# Patient Record
Sex: Female | Born: 1955 | Race: White | Hispanic: No | Marital: Married | Smoking: Never smoker
Health system: Southern US, Community
[De-identification: ages and names within clinical notes are randomized; demographics above are authoritative.]

## PROBLEM LIST (undated history)

## (undated) DIAGNOSIS — E039 Hypothyroidism, unspecified: Secondary | ICD-10-CM

## (undated) DIAGNOSIS — Z87442 Personal history of urinary calculi: Secondary | ICD-10-CM

## (undated) DIAGNOSIS — I340 Nonrheumatic mitral (valve) insufficiency: Secondary | ICD-10-CM

## (undated) DIAGNOSIS — I493 Ventricular premature depolarization: Secondary | ICD-10-CM

## (undated) DIAGNOSIS — E079 Disorder of thyroid, unspecified: Secondary | ICD-10-CM

## (undated) DIAGNOSIS — I10 Essential (primary) hypertension: Secondary | ICD-10-CM

## (undated) HISTORY — DX: Personal history of urinary calculi: Z87.442

## (undated) HISTORY — DX: Disorder of thyroid, unspecified: E07.9

## (undated) HISTORY — DX: Ventricular premature depolarization: I49.3

## (undated) HISTORY — DX: Essential (primary) hypertension: I10

## (undated) HISTORY — DX: Hypothyroidism, unspecified: E03.9

## (undated) HISTORY — DX: Nonrheumatic mitral (valve) insufficiency: I34.0

## (undated) HISTORY — PX: TONSILLECTOMY: SUR1361

## (undated) HISTORY — PX: FOOT SURGERY: SHX648

## (undated) HISTORY — PX: CATARACT EXTRACTION, BILATERAL: SHX1313

## (undated) HISTORY — PX: GASTRIC BYPASS: SHX52

---

## 1986-11-20 HISTORY — PX: NASAL SEPTUM SURGERY: SHX37

## 1998-03-12 ENCOUNTER — Encounter: Admission: RE | Admit: 1998-03-12 | Discharge: 1998-03-12 | Payer: Self-pay | Admitting: Family Medicine

## 1999-06-20 ENCOUNTER — Other Ambulatory Visit: Admission: RE | Admit: 1999-06-20 | Discharge: 1999-06-20 | Payer: Self-pay | Admitting: Obstetrics and Gynecology

## 1999-07-11 ENCOUNTER — Encounter: Admission: RE | Admit: 1999-07-11 | Discharge: 1999-07-11 | Payer: Self-pay | Admitting: Family Medicine

## 1999-07-15 ENCOUNTER — Encounter: Admission: RE | Admit: 1999-07-15 | Discharge: 1999-07-15 | Payer: Self-pay | Admitting: Family Medicine

## 2000-01-20 ENCOUNTER — Encounter: Admission: RE | Admit: 2000-01-20 | Discharge: 2000-01-20 | Payer: Self-pay | Admitting: Family Medicine

## 2000-06-14 ENCOUNTER — Other Ambulatory Visit: Admission: RE | Admit: 2000-06-14 | Discharge: 2000-06-14 | Payer: Self-pay | Admitting: Obstetrics and Gynecology

## 2001-02-22 ENCOUNTER — Encounter: Admission: RE | Admit: 2001-02-22 | Discharge: 2001-02-22 | Payer: Self-pay | Admitting: Family Medicine

## 2001-02-25 ENCOUNTER — Encounter: Admission: RE | Admit: 2001-02-25 | Discharge: 2001-02-25 | Payer: Self-pay | Admitting: Family Medicine

## 2001-03-04 ENCOUNTER — Encounter: Admission: RE | Admit: 2001-03-04 | Discharge: 2001-03-04 | Payer: Self-pay | Admitting: Family Medicine

## 2001-03-11 ENCOUNTER — Encounter: Admission: RE | Admit: 2001-03-11 | Discharge: 2001-03-11 | Payer: Self-pay | Admitting: Family Medicine

## 2001-04-29 ENCOUNTER — Encounter: Admission: RE | Admit: 2001-04-29 | Discharge: 2001-04-29 | Payer: Self-pay | Admitting: Family Medicine

## 2001-05-13 ENCOUNTER — Encounter: Admission: RE | Admit: 2001-05-13 | Discharge: 2001-05-13 | Payer: Self-pay | Admitting: Family Medicine

## 2001-08-05 ENCOUNTER — Encounter: Admission: RE | Admit: 2001-08-05 | Discharge: 2001-08-05 | Payer: Self-pay | Admitting: Family Medicine

## 2001-08-14 ENCOUNTER — Encounter: Admission: RE | Admit: 2001-08-14 | Discharge: 2001-08-14 | Payer: Self-pay | Admitting: Family Medicine

## 2001-09-09 ENCOUNTER — Other Ambulatory Visit: Admission: RE | Admit: 2001-09-09 | Discharge: 2001-09-09 | Payer: Self-pay | Admitting: Obstetrics and Gynecology

## 2001-09-18 ENCOUNTER — Encounter: Payer: Self-pay | Admitting: Obstetrics and Gynecology

## 2001-09-18 ENCOUNTER — Encounter: Admission: RE | Admit: 2001-09-18 | Discharge: 2001-09-18 | Payer: Self-pay | Admitting: Internal Medicine

## 2002-04-07 ENCOUNTER — Encounter: Admission: RE | Admit: 2002-04-07 | Discharge: 2002-04-07 | Payer: Self-pay | Admitting: Family Medicine

## 2002-10-02 ENCOUNTER — Encounter: Admission: RE | Admit: 2002-10-02 | Discharge: 2002-10-02 | Payer: Self-pay | Admitting: Family Medicine

## 2003-03-03 ENCOUNTER — Encounter: Admission: RE | Admit: 2003-03-03 | Discharge: 2003-03-03 | Payer: Self-pay | Admitting: Family Medicine

## 2003-03-16 ENCOUNTER — Encounter: Admission: RE | Admit: 2003-03-16 | Discharge: 2003-03-16 | Payer: Self-pay | Admitting: Family Medicine

## 2003-05-04 ENCOUNTER — Encounter: Admission: RE | Admit: 2003-05-04 | Discharge: 2003-05-04 | Payer: Self-pay | Admitting: Sports Medicine

## 2003-05-04 ENCOUNTER — Encounter: Payer: Self-pay | Admitting: Sports Medicine

## 2003-05-04 ENCOUNTER — Encounter: Admission: RE | Admit: 2003-05-04 | Discharge: 2003-05-04 | Payer: Self-pay | Admitting: Family Medicine

## 2003-06-09 ENCOUNTER — Other Ambulatory Visit: Admission: RE | Admit: 2003-06-09 | Discharge: 2003-06-09 | Payer: Self-pay | Admitting: Obstetrics and Gynecology

## 2004-07-05 ENCOUNTER — Encounter: Admission: RE | Admit: 2004-07-05 | Discharge: 2004-07-05 | Payer: Self-pay | Admitting: Obstetrics and Gynecology

## 2004-08-05 ENCOUNTER — Ambulatory Visit: Payer: Self-pay | Admitting: Family Medicine

## 2004-08-31 ENCOUNTER — Ambulatory Visit: Payer: Self-pay | Admitting: Family Medicine

## 2004-09-26 ENCOUNTER — Ambulatory Visit: Payer: Self-pay

## 2004-10-21 ENCOUNTER — Ambulatory Visit: Payer: Self-pay | Admitting: Internal Medicine

## 2005-11-10 ENCOUNTER — Ambulatory Visit: Payer: Self-pay | Admitting: Family Medicine

## 2005-12-11 ENCOUNTER — Encounter: Admission: RE | Admit: 2005-12-11 | Discharge: 2005-12-11 | Payer: Self-pay | Admitting: Obstetrics and Gynecology

## 2006-05-20 ENCOUNTER — Encounter (INDEPENDENT_AMBULATORY_CARE_PROVIDER_SITE_OTHER): Payer: Self-pay | Admitting: *Deleted

## 2006-05-20 LAB — CONVERTED CEMR LAB

## 2006-10-05 ENCOUNTER — Ambulatory Visit: Payer: Self-pay | Admitting: Family Medicine

## 2006-10-08 ENCOUNTER — Ambulatory Visit: Payer: Self-pay | Admitting: Family Medicine

## 2006-10-17 ENCOUNTER — Encounter: Payer: Self-pay | Admitting: Cardiovascular Disease

## 2006-10-17 ENCOUNTER — Encounter (INDEPENDENT_AMBULATORY_CARE_PROVIDER_SITE_OTHER): Payer: Self-pay | Admitting: *Deleted

## 2006-10-17 ENCOUNTER — Ambulatory Visit (HOSPITAL_COMMUNITY): Admission: RE | Admit: 2006-10-17 | Discharge: 2006-10-17 | Payer: Self-pay | Admitting: Family Medicine

## 2006-10-17 ENCOUNTER — Ambulatory Visit: Payer: Self-pay | Admitting: Cardiovascular Disease

## 2006-11-02 ENCOUNTER — Ambulatory Visit: Payer: Self-pay | Admitting: Family Medicine

## 2007-01-03 ENCOUNTER — Encounter: Payer: Self-pay | Admitting: Family Medicine

## 2007-01-03 ENCOUNTER — Ambulatory Visit: Payer: Self-pay | Admitting: Sports Medicine

## 2007-01-03 ENCOUNTER — Encounter: Admission: RE | Admit: 2007-01-03 | Discharge: 2007-01-03 | Payer: Self-pay | Admitting: Obstetrics and Gynecology

## 2007-01-03 LAB — CONVERTED CEMR LAB
Chloride: 99 meq/L (ref 96–112)
Creatinine, Ser: 0.91 mg/dL (ref 0.40–1.20)
Direct LDL: 113 mg/dL — ABNORMAL HIGH
Potassium: 3.8 meq/L (ref 3.5–5.3)
Sodium: 139 meq/L (ref 135–145)

## 2007-01-07 ENCOUNTER — Ambulatory Visit: Payer: Self-pay | Admitting: Family Medicine

## 2007-01-17 DIAGNOSIS — E669 Obesity, unspecified: Secondary | ICD-10-CM | POA: Insufficient documentation

## 2007-01-17 DIAGNOSIS — G4730 Sleep apnea, unspecified: Secondary | ICD-10-CM | POA: Insufficient documentation

## 2007-01-17 DIAGNOSIS — E7800 Pure hypercholesterolemia, unspecified: Secondary | ICD-10-CM

## 2007-01-17 DIAGNOSIS — I10 Essential (primary) hypertension: Secondary | ICD-10-CM | POA: Insufficient documentation

## 2007-01-17 DIAGNOSIS — N951 Menopausal and female climacteric states: Secondary | ICD-10-CM

## 2007-01-17 DIAGNOSIS — E039 Hypothyroidism, unspecified: Secondary | ICD-10-CM

## 2007-01-17 DIAGNOSIS — E785 Hyperlipidemia, unspecified: Secondary | ICD-10-CM | POA: Insufficient documentation

## 2007-01-18 ENCOUNTER — Encounter (INDEPENDENT_AMBULATORY_CARE_PROVIDER_SITE_OTHER): Payer: Self-pay | Admitting: *Deleted

## 2007-02-19 ENCOUNTER — Telehealth: Payer: Self-pay | Admitting: Family Medicine

## 2007-04-05 ENCOUNTER — Telehealth: Payer: Self-pay | Admitting: *Deleted

## 2007-05-22 ENCOUNTER — Ambulatory Visit: Payer: Self-pay | Admitting: Pulmonary Disease

## 2007-06-13 ENCOUNTER — Ambulatory Visit: Payer: Self-pay | Admitting: Pulmonary Disease

## 2007-06-13 LAB — CONVERTED CEMR LAB
ALT: 24 units/L (ref 0–35)
Albumin: 3.5 g/dL (ref 3.5–5.2)
Alkaline Phosphatase: 57 units/L (ref 39–117)
BUN: 18 mg/dL (ref 6–23)
Basophils Relative: 3.4 % — ABNORMAL HIGH (ref 0.0–1.0)
Bilirubin, Direct: 0.1 mg/dL (ref 0.0–0.3)
CO2: 32 meq/L (ref 19–32)
Chloride: 106 meq/L (ref 96–112)
Creatinine, Ser: 0.8 mg/dL (ref 0.4–1.2)
Eosinophils Relative: 4.3 % (ref 0.0–5.0)
GFR calc Af Amer: 97 mL/min
HDL: 38.4 mg/dL — ABNORMAL LOW (ref >39.0)
LDL Cholesterol: 108 mg/dL — ABNORMAL HIGH (ref 0–99)
MCHC: 35.1 g/dL (ref 30.0–36.0)
Monocytes Relative: 8.5 % (ref 3.0–11.0)
Potassium: 3.7 meq/L (ref 3.5–5.1)
RDW: 12.6 % (ref 11.5–14.6)
Sodium: 147 meq/L — ABNORMAL HIGH (ref 135–145)
TSH: 5.68 microintl units/mL — ABNORMAL HIGH (ref 0.35–5.50)
Total Bilirubin: 0.7 mg/dL (ref 0.3–1.2)
Total Protein: 6.3 g/dL (ref 6.0–8.3)
Triglycerides: 177 mg/dL — ABNORMAL HIGH (ref 0–149)

## 2007-06-25 ENCOUNTER — Encounter: Payer: Self-pay | Admitting: Family Medicine

## 2007-06-27 ENCOUNTER — Telehealth: Payer: Self-pay | Admitting: *Deleted

## 2007-06-28 ENCOUNTER — Ambulatory Visit: Payer: Self-pay | Admitting: Pulmonary Disease

## 2007-08-29 ENCOUNTER — Ambulatory Visit (HOSPITAL_COMMUNITY): Admission: RE | Admit: 2007-08-29 | Discharge: 2007-08-29 | Payer: Self-pay | Admitting: Obstetrics and Gynecology

## 2007-09-03 ENCOUNTER — Ambulatory Visit (HOSPITAL_COMMUNITY): Admission: RE | Admit: 2007-09-03 | Discharge: 2007-09-03 | Payer: Self-pay | Admitting: Surgery

## 2007-09-13 ENCOUNTER — Telehealth: Payer: Self-pay | Admitting: Family Medicine

## 2007-09-17 ENCOUNTER — Encounter: Admission: RE | Admit: 2007-09-17 | Discharge: 2007-09-17 | Payer: Self-pay | Admitting: Surgery

## 2007-10-29 ENCOUNTER — Ambulatory Visit: Payer: Self-pay | Admitting: Pulmonary Disease

## 2007-11-15 ENCOUNTER — Telehealth: Payer: Self-pay | Admitting: Pulmonary Disease

## 2007-11-26 ENCOUNTER — Ambulatory Visit: Payer: Self-pay | Admitting: Pulmonary Disease

## 2007-12-01 LAB — CONVERTED CEMR LAB: TSH: 2.6 microintl units/mL (ref 0.35–5.50)

## 2008-01-16 ENCOUNTER — Encounter: Admission: RE | Admit: 2008-01-16 | Discharge: 2008-01-16 | Payer: Self-pay | Admitting: Obstetrics and Gynecology

## 2008-04-06 ENCOUNTER — Telehealth (INDEPENDENT_AMBULATORY_CARE_PROVIDER_SITE_OTHER): Payer: Self-pay | Admitting: *Deleted

## 2008-04-20 HISTORY — PX: GASTRIC BYPASS: SHX52

## 2008-04-21 ENCOUNTER — Encounter: Admission: RE | Admit: 2008-04-21 | Discharge: 2008-07-20 | Payer: Self-pay | Admitting: Surgery

## 2008-04-23 ENCOUNTER — Encounter: Payer: Self-pay | Admitting: Pulmonary Disease

## 2008-04-27 ENCOUNTER — Telehealth (INDEPENDENT_AMBULATORY_CARE_PROVIDER_SITE_OTHER): Payer: Self-pay | Admitting: *Deleted

## 2008-05-05 ENCOUNTER — Inpatient Hospital Stay (HOSPITAL_COMMUNITY): Admission: RE | Admit: 2008-05-05 | Discharge: 2008-05-07 | Payer: Self-pay | Admitting: Surgery

## 2008-05-06 ENCOUNTER — Ambulatory Visit: Payer: Self-pay | Admitting: Vascular Surgery

## 2008-05-06 ENCOUNTER — Encounter (INDEPENDENT_AMBULATORY_CARE_PROVIDER_SITE_OTHER): Payer: Self-pay | Admitting: Surgery

## 2008-05-08 ENCOUNTER — Telehealth (INDEPENDENT_AMBULATORY_CARE_PROVIDER_SITE_OTHER): Payer: Self-pay | Admitting: *Deleted

## 2008-05-27 ENCOUNTER — Encounter: Payer: Self-pay | Admitting: Pulmonary Disease

## 2008-06-08 ENCOUNTER — Ambulatory Visit: Payer: Self-pay | Admitting: Internal Medicine

## 2008-06-08 ENCOUNTER — Telehealth (INDEPENDENT_AMBULATORY_CARE_PROVIDER_SITE_OTHER): Payer: Self-pay | Admitting: *Deleted

## 2008-07-07 ENCOUNTER — Ambulatory Visit: Payer: Self-pay | Admitting: Pulmonary Disease

## 2008-07-07 DIAGNOSIS — L259 Unspecified contact dermatitis, unspecified cause: Secondary | ICD-10-CM | POA: Insufficient documentation

## 2008-07-08 ENCOUNTER — Ambulatory Visit: Payer: Self-pay | Admitting: Pulmonary Disease

## 2008-07-12 LAB — CONVERTED CEMR LAB: Vit D, 1,25-Dihydroxy: 25 — ABNORMAL LOW (ref 30–89)

## 2008-07-13 ENCOUNTER — Telehealth (INDEPENDENT_AMBULATORY_CARE_PROVIDER_SITE_OTHER): Payer: Self-pay | Admitting: *Deleted

## 2008-07-13 LAB — CONVERTED CEMR LAB
Albumin: 3.5 g/dL (ref 3.5–5.2)
BUN: 11 mg/dL (ref 6–23)
CO2: 29 meq/L (ref 19–32)
Creatinine, Ser: 0.6 mg/dL (ref 0.4–1.2)
Eosinophils Absolute: 0.2 10*3/uL (ref 0.0–0.7)
GFR calc Af Amer: 134 mL/min
GFR calc non Af Amer: 111 mL/min
Glucose, Bld: 98 mg/dL (ref 70–99)
HCT: 36.8 % (ref 36.0–46.0)
MCHC: 34.3 g/dL (ref 30.0–36.0)
MCV: 91.8 fL (ref 78.0–100.0)
Monocytes Absolute: 0.4 10*3/uL (ref 0.1–1.0)
Neutrophils Relative %: 45.3 % (ref 43.0–77.0)
Platelets: 140 10*3/uL — ABNORMAL LOW (ref 150–400)
RDW: 15.2 % — ABNORMAL HIGH (ref 11.5–14.6)
Sodium: 147 meq/L — ABNORMAL HIGH (ref 135–145)
Total Bilirubin: 0.8 mg/dL (ref 0.3–1.2)
Total CHOL/HDL Ratio: 4.8
Total Protein: 5.8 g/dL — ABNORMAL LOW (ref 6.0–8.3)

## 2008-10-02 ENCOUNTER — Telehealth (INDEPENDENT_AMBULATORY_CARE_PROVIDER_SITE_OTHER): Payer: Self-pay | Admitting: *Deleted

## 2008-10-02 ENCOUNTER — Encounter: Payer: Self-pay | Admitting: Pulmonary Disease

## 2009-01-21 ENCOUNTER — Encounter: Admission: RE | Admit: 2009-01-21 | Discharge: 2009-01-21 | Payer: Self-pay | Admitting: Obstetrics and Gynecology

## 2009-04-20 ENCOUNTER — Ambulatory Visit: Payer: Self-pay | Admitting: Pulmonary Disease

## 2009-04-20 DIAGNOSIS — M109 Gout, unspecified: Secondary | ICD-10-CM | POA: Insufficient documentation

## 2009-04-24 DIAGNOSIS — E559 Vitamin D deficiency, unspecified: Secondary | ICD-10-CM | POA: Insufficient documentation

## 2009-04-24 LAB — CONVERTED CEMR LAB
Albumin: 3.8 g/dL (ref 3.5–5.2)
Alkaline Phosphatase: 75 units/L (ref 39–117)
Basophils Absolute: 0 10*3/uL (ref 0.0–0.1)
Basophils Relative: 0.8 % (ref 0.0–3.0)
Chloride: 108 meq/L (ref 96–112)
Eosinophils Relative: 3.5 % (ref 0.0–5.0)
HDL: 54.2 mg/dL (ref >39.00)
Hemoglobin: 13.6 g/dL (ref 12.0–15.0)
Lymphocytes Relative: 32.1 % (ref 12.0–46.0)
Lymphs Abs: 1.2 10*3/uL (ref 0.7–4.0)
MCHC: 34.9 g/dL (ref 30.0–36.0)
MCV: 89.5 fL (ref 78.0–100.0)
Neutro Abs: 2 10*3/uL (ref 1.4–7.7)
Neutrophils Relative %: 54.8 % (ref 43.0–77.0)
Platelets: 126 10*3/uL — ABNORMAL LOW (ref 150.0–400.0)
Potassium: 3.9 meq/L (ref 3.5–5.1)
RDW: 12.6 % (ref 11.5–14.6)
Sodium: 144 meq/L (ref 135–145)
Total CHOL/HDL Ratio: 3
Triglycerides: 117 mg/dL (ref 0.0–149.0)
Uric Acid, Serum: 3.3 mg/dL (ref 2.4–7.0)
Vitamin B-12: >1500 pg/mL — ABNORMAL HIGH (ref 211–911)

## 2009-04-27 ENCOUNTER — Telehealth: Payer: Self-pay | Admitting: Pulmonary Disease

## 2009-06-17 ENCOUNTER — Telehealth: Payer: Self-pay | Admitting: Pulmonary Disease

## 2009-08-02 ENCOUNTER — Telehealth: Payer: Self-pay | Admitting: Pulmonary Disease

## 2009-08-04 ENCOUNTER — Ambulatory Visit: Payer: Self-pay | Admitting: Pulmonary Disease

## 2009-08-04 DIAGNOSIS — N390 Urinary tract infection, site not specified: Secondary | ICD-10-CM | POA: Insufficient documentation

## 2009-08-06 ENCOUNTER — Ambulatory Visit (HOSPITAL_COMMUNITY): Admission: RE | Admit: 2009-08-06 | Discharge: 2009-08-06 | Payer: Self-pay | Admitting: Pulmonary Disease

## 2009-08-07 LAB — CONVERTED CEMR LAB
AST: 20 units/L (ref 0–37)
Albumin: 3.8 g/dL (ref 3.5–5.2)
BUN: 14 mg/dL (ref 6–23)
Basophils Absolute: 0 10*3/uL (ref 0.0–0.1)
Basophils Relative: 0.8 % (ref 0.0–3.0)
CO2: 33 meq/L — ABNORMAL HIGH (ref 19–32)
Eosinophils Relative: 5.3 % — ABNORMAL HIGH (ref 0.0–5.0)
Glucose, Bld: 93 mg/dL (ref 70–99)
Hemoglobin, Urine: NEGATIVE
Hemoglobin: 13.6 g/dL (ref 12.0–15.0)
Ketones, ur: NEGATIVE mg/dL
Lymphocytes Relative: 37 % (ref 12.0–46.0)
Monocytes Absolute: 0.4 10*3/uL (ref 0.1–1.0)
Neutro Abs: 1.6 10*3/uL (ref 1.4–7.7)
Neutrophils Relative %: 46.4 % (ref 43.0–77.0)
Platelets: 118 10*3/uL — ABNORMAL LOW (ref 150.0–400.0)
Potassium: 4.1 meq/L (ref 3.5–5.1)
Sodium: 145 meq/L (ref 135–145)
TSH: 0.04 microintl units/mL — ABNORMAL LOW (ref 0.35–5.50)
Total Bilirubin: 0.8 mg/dL (ref 0.3–1.2)
Total Protein, Urine: NEGATIVE mg/dL
Total Protein: 6.3 g/dL (ref 6.0–8.3)
Urine Glucose: NEGATIVE mg/dL
Urobilinogen, UA: 0.2 (ref 0.0–1.0)

## 2009-09-20 ENCOUNTER — Telehealth (INDEPENDENT_AMBULATORY_CARE_PROVIDER_SITE_OTHER): Payer: Self-pay | Admitting: *Deleted

## 2009-09-28 ENCOUNTER — Telehealth (INDEPENDENT_AMBULATORY_CARE_PROVIDER_SITE_OTHER): Payer: Self-pay | Admitting: *Deleted

## 2009-09-30 ENCOUNTER — Ambulatory Visit: Payer: Self-pay | Admitting: Pulmonary Disease

## 2009-10-27 ENCOUNTER — Encounter: Payer: Self-pay | Admitting: Pulmonary Disease

## 2009-11-16 LAB — CONVERTED CEMR LAB
Bilirubin Urine: NEGATIVE
Hemoglobin, Urine: NEGATIVE

## 2010-01-24 ENCOUNTER — Encounter: Admission: RE | Admit: 2010-01-24 | Discharge: 2010-01-24 | Payer: Self-pay | Admitting: Obstetrics and Gynecology

## 2010-09-08 ENCOUNTER — Ambulatory Visit (HOSPITAL_COMMUNITY): Admission: RE | Admit: 2010-09-08 | Discharge: 2010-09-08 | Payer: Self-pay | Admitting: Internal Medicine

## 2010-12-30 ENCOUNTER — Other Ambulatory Visit: Payer: Self-pay | Admitting: Internal Medicine

## 2010-12-30 DIAGNOSIS — Z1231 Encounter for screening mammogram for malignant neoplasm of breast: Secondary | ICD-10-CM

## 2011-01-26 ENCOUNTER — Ambulatory Visit
Admission: RE | Admit: 2011-01-26 | Discharge: 2011-01-26 | Disposition: A | Discharge status: Home / Self Care | Payer: BC Managed Care – PPO | Source: Ambulatory Visit | Attending: Internal Medicine | Admitting: Internal Medicine

## 2011-01-26 DIAGNOSIS — Z1231 Encounter for screening mammogram for malignant neoplasm of breast: Secondary | ICD-10-CM

## 2011-04-04 NOTE — Op Note (Signed)
NAMEYESLI, VANDERHOFF               ACCOUNT NO.:  [id number]   MEDICAL RECORD NO.:  [id number]          PATIENT TYPE:  INP   LOCATION:  0003                         FACILITY:  [facility]   PHYSICIAN:  Sandria Bales. Ezzard Standing, M.D.  DATE OF BIRTH:  1955-12-18   DATE OF PROCEDURE:  05/05/2008  DATE OF DISCHARGE:                               OPERATIVE REPORT   PREOPERATIVE DIAGNOSIS:  Morbid obesity, BMI of 48.4, weight 290.   POSTOPERATIVE DIAGNOSIS:  Morbid obesity, BMI of 48.4, weight 290.   PROCEDURE:  Laparoscopic Roux En Y gastric bypass (antecolic,  antegastric), upper endoscopy.   SURGEON:  Sandria Bales. Ezzard Standing, M.D.   ASSISTANT:  Thornton Park. Daphine Deutscher, M.D.   ANESTHESIA:  General endotracheal.   ESTIMATED BLOOD LOSS:  Minimal.   INDICATIONS FOR PROCEDURE:  The patient is a 55 year old white female  patient of Dr. Kriste Basque who has been through our preoperative bariatric  program.  She has contemplated and reviewed both Lap-Band and Roux En Y  gastric bypass and is interested in getting Roux En Y gastric bypass.   The indications and potential complications of gastric bypass are  explained to the patient.  The potential complications include, but are  not limited to bleeding, infection, bowel injury or leak, open surgery,  deep venous thrombosis/pulmonary embolism, and longterm nutritional  consequences.  The patient now comes to the operating room for surgery.   DESCRIPTION OF PROCEDURE:  The patient was placed in the supine position  and given a general endotracheal anesthetic.  She had PAS stockings in  place.  She was given antibiotics preoperatively.  She had an OG-tube  placed.  Her abdomen was prepped with Techni-Care and sterilely draped.  A time out was held identifying the patient and the procedure.   The abdomen was accessed through the left upper quadrant.  She did have  a bout of hypotension at the very beginning of the case.  The patient  was placed in Trendelenburg for about 5  minutes before we accessed the  abdominal cavity but this resolved with fluids by anesthesia.  Of note,  there was no further hypotension during the case.   The patient's abdominal cavity was accessed with a 0 degree 11 mm  Ethicon OptiVu in the left upper quadrant.  I placed 6 additional  trocars.  I placed a 5 mm subxiphoid trocar, I placed a 12 mm right  subcostal, a 12 mm right paramedian, a 12 mm left paramedian, and a 5 mm  lateral subcostal, and now placed an 11 mm trocar to the right of the  umbilicus.  The first thing to do was abdominal exploration.  The right  and left lobes of the liver was unremarkable.  The stomach that I could  see was unremarkable.  The bowel that I could see was unremarkable.  She  had a fairly generous omentum.  I started out by identifying the  ligament of Treitz and small bowel.  I measured 40 cm from the ligament  of Treitz.  I used a white load of the 45 Ethicon endo-GIA stapler and  divided the small bowel.  I marked the future gastric limb with the  Penrose drain.   I then counted 100 cm for the future gastric limb.  This 100 cm fell to  the right of the abdomen.  I then carried out a jejunojejunostomy.  I  did a holding suture, placed enterotomy in the jejunum.  I did the  anastomosis with a 45 mm Ethicon Endo-GIA white staple load.  I closed  the enterotomy with two running 2-0 Vicryl sutures.  There was no  evidence of any leak or disruption in the suture line.  I then closed  the mesentery of the jejunojejunostomy with a running 2-0 silk with a  lap tie on both ends.  I tried also incorporating some of an  antiobstruction stitch up at the jejunojejunostomy.   I then covered the anastomosis with Tisseel.  The anastomosis looked  healthy without any leak.   I then divided the omentum up to the transverse colon and this was just  a little to the left of the midline.  Again she had a fairly generous  omentum, though it was not overly thick.   The patient was then placed in  Trendelenburg position, a Nathanson liver retractor placed on the left  lobe of the liver and started with dissection.   I counted down about 5 cm along the lesser curvature.  I then got into  the lesser sac and level of lesser curvature.  I divided the stomach  initially with a 45 Endo-GIA blue stapler and then I used two loads of  the 60 Endo-GIA stapler and then another 45 blue suture to create a  pouch approximately 5 cm in length and 3 cm in width.   I oversewed the distal gastric remnant with a locking 2-0 Vicryl suture.  I placed Tisseel along the future greater curvature of the stomach  pouch.  I then brought the jejunum up antecolic antegastric and sewed it  to the gastric pouch with a posterior row of running 2-0 Vicryl suture.   After the pouch was tacked to the jejunum, I did an enterotomy in the  stomach over the Roux En Y tube.  I did an enterotomy on the jejunal  side and I passed the Endo-GIA stapler with a blue in about 30-35 cm  trying to create a 2.5 to 3 cm anastomosis.   After the firing of this, I then closed the gastric enterotomy with two  running 2-0 Vicryl sutures.  I was worried about one little area of the  anterior anastomosis and I put a little Z-stitch of 2-0 Vicryl over  this.  I then did an anterior running 2-0 Vicryl suture after I had  passed the ewall tube through the anastomosis.  The anastomosis was with  the stomach and jejunum looked healthy.  There was no evidence of any  ischemia.   At this time Dr. Daphine Deutscher scrubbed out.  He went the head of the table and  did an upper endoscopy which he will dictate.  He passed the endoscope  into the gastric pouch and insufflated air.  He saw the GE junction  about 40 cm and the gastrojejunal anastomosis at 45 cm for about a 5 cm  pouch.  There was no air leak.  The mucosa was healthy as was the  anastomosis.  He then withdrew the scope.  I irrigated the upper abdomen  with  saline looking for any kind of air leak which there  was not.  I  then placed Tisseel over the gastrojejunostomy and over the stump of the  jejunum.  Our anastomosis looked patent and healthy.  The bowel laid in  the correct plane.  The jejunojejunostomy looked healthy and the omentum  had been divided.   I then removed the trocars in turn.  I closed each skin site with a 5-0  Monocryl suture.  I painted the wound with tincture Benzoin and Steri-  Strips.   The patient tolerated the procedure well and was transported to the  recovery room in good condition.  Needle, sponge, and instrument counts  correct at the end of the case.      Sandria Bales. Ezzard Standing, M.D.  Electronically Signed     DHN/MEDQ  D:  05/05/2008  T:  05/05/2008  Job:  [id number]   cc:   Lonzo Cloud. Kriste Basque, MD  [address]  [place]  [place] [address]   Cordelia Pen A. Rosalio Macadamia, M.D.  Fax: [phone]

## 2011-04-11 ENCOUNTER — Encounter (INDEPENDENT_AMBULATORY_CARE_PROVIDER_SITE_OTHER): Payer: Self-pay | Admitting: Surgery

## 2011-05-31 ENCOUNTER — Encounter: Payer: Self-pay | Admitting: Family Medicine

## 2011-05-31 ENCOUNTER — Ambulatory Visit (INDEPENDENT_AMBULATORY_CARE_PROVIDER_SITE_OTHER): Payer: BC Managed Care – PPO | Admitting: Family Medicine

## 2011-05-31 DIAGNOSIS — E039 Hypothyroidism, unspecified: Secondary | ICD-10-CM

## 2011-05-31 DIAGNOSIS — I10 Essential (primary) hypertension: Secondary | ICD-10-CM

## 2011-05-31 DIAGNOSIS — F419 Anxiety disorder, unspecified: Secondary | ICD-10-CM

## 2011-05-31 DIAGNOSIS — Z23 Encounter for immunization: Secondary | ICD-10-CM

## 2011-05-31 DIAGNOSIS — H18519 Endothelial corneal dystrophy, unspecified eye: Secondary | ICD-10-CM | POA: Insufficient documentation

## 2011-05-31 DIAGNOSIS — E559 Vitamin D deficiency, unspecified: Secondary | ICD-10-CM

## 2011-05-31 DIAGNOSIS — F411 Generalized anxiety disorder: Secondary | ICD-10-CM

## 2011-05-31 DIAGNOSIS — E7800 Pure hypercholesterolemia, unspecified: Secondary | ICD-10-CM

## 2011-05-31 LAB — POCT URINALYSIS DIPSTICK
Bilirubin, UA: NEGATIVE
Leukocytes, UA: NEGATIVE

## 2011-05-31 MED ORDER — LORAZEPAM 1 MG PO TABS: 1.0000 mg | ORAL_TABLET | Freq: Every day | ORAL | Status: DC

## 2011-05-31 MED ORDER — TETANUS-DIPHTH-ACELL PERTUSSIS 5-2.5-18.5 LF-MCG/0.5 IM SUSP
0.5000 mL | Freq: Once | INTRAMUSCULAR | Status: AC
  Administered 2011-05-31: 0.5 mL via INTRAMUSCULAR

## 2011-05-31 MED ORDER — POTASSIUM CHLORIDE CRYS ER 10 MEQ PO TBCR: 15.0000 meq | EXTENDED_RELEASE_TABLET | Freq: Every day | ORAL | Status: DC

## 2011-05-31 MED ORDER — LEVOTHYROXINE SODIUM 112 MCG PO TABS: 224.0000 ug | ORAL_TABLET | Freq: Every day | ORAL | Status: DC

## 2011-05-31 MED ORDER — ATENOLOL 100 MG PO TABS: 100.0000 mg | ORAL_TABLET | Freq: Every day | ORAL | Status: DC

## 2011-05-31 NOTE — Assessment & Plan Note (Signed)
Currently well controled

## 2011-05-31 NOTE — Assessment & Plan Note (Signed)
Check labs 

## 2011-05-31 NOTE — Assessment & Plan Note (Signed)
Actually takes 1.5 tabs =168 micrograms of levothyroxine daily.  Due for TSH check

## 2011-05-31 NOTE — Progress Notes (Signed)
  Subjective:    Patient ID: Jacqueline Hayes, female    DOB: 09/27/1956, 55 y.o.   MRN: [id number]  HPI Jacqueline Hayes returns after several year hiatus.  She had bariatric surgery and has lost 130 lbs total.  She feels much improved and is delighted she took the leap.   Hypertension - remains on atenolol and control has been good.   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 Anxiety an intermitant issue.  Takes lorazepam very rarely. No other complaints.    Review of Systems     Objective:   Physical Exam Lungs clear Cardiac RRR       Assessment & Plan:

## 2011-06-01 ENCOUNTER — Encounter: Payer: Self-pay | Admitting: Family Medicine

## 2011-06-01 LAB — COMPLETE METABOLIC PANEL WITH GFR
Alkaline Phosphatase: 99 U/L (ref 39–117)
CO2: 31 mEq/L (ref 19–32)
Chloride: 102 mEq/L (ref 96–112)
GFR, Est Non African American: >60 mL/min (ref >60)
Glucose, Bld: 83 mg/dL (ref 70–99)
Sodium: 140 mEq/L (ref 135–145)

## 2011-06-01 LAB — LIPID PANEL
Cholesterol: 205 mg/dL — ABNORMAL HIGH (ref 0–200)
HDL: 72 mg/dL (ref >39)
Total CHOL/HDL Ratio: 2.8 Ratio
Triglycerides: 177 mg/dL — ABNORMAL HIGH (ref <150)

## 2011-06-01 LAB — VITAMIN D 25 HYDROXY (VIT D DEFICIENCY, FRACTURES): Vit D, 25-Hydroxy: 59 ng/mL (ref 30–89)

## 2011-06-01 LAB — CBC
Hemoglobin: 13.6 g/dL (ref 12.0–15.0)
MCH: 30 pg (ref 26.0–34.0)
RBC: 4.54 MIL/uL (ref 3.87–5.11)

## 2011-08-17 LAB — CBC
HCT: 39.1
HCT: 34 — ABNORMAL LOW
Hemoglobin: 13.2
Hemoglobin: 11.4 — ABNORMAL LOW
Hemoglobin: 11.6 — ABNORMAL LOW
Hemoglobin: 11.9 — ABNORMAL LOW
MCHC: 33.8
MCHC: 33.8
MCV: 90.7
MCV: 91.4
RBC: 3.72 — ABNORMAL LOW
RBC: 3.77 — ABNORMAL LOW
RBC: 3.88
WBC: 4.6
WBC: 8.3

## 2011-08-17 LAB — PREGNANCY, URINE: Preg Test, Ur: NEGATIVE

## 2011-08-17 LAB — DIFFERENTIAL
Basophils Relative: 1
Eosinophils Absolute: 0
Eosinophils Absolute: 0.1
Eosinophils Relative: 2
Lymphocytes Relative: 21
Lymphs Abs: 1
Lymphs Abs: 0.6 — ABNORMAL LOW
Monocytes Absolute: 0.7
Monocytes Absolute: 0.4
Monocytes Absolute: 0.6
Monocytes Relative: 15 — ABNORMAL HIGH
Monocytes Relative: 7
Monocytes Relative: 7
Neutro Abs: 2.9
Neutrophils Relative %: 65
Neutrophils Relative %: 86 — ABNORMAL HIGH

## 2011-08-17 LAB — BASIC METABOLIC PANEL
BUN: 34 — ABNORMAL HIGH
CO2: 26
Chloride: 101
Glucose, Bld: 104 — ABNORMAL HIGH
Potassium: 4.5

## 2011-08-17 LAB — PROTIME-INR: INR: 1

## 2011-10-16 ENCOUNTER — Telehealth (INDEPENDENT_AMBULATORY_CARE_PROVIDER_SITE_OTHER): Payer: Self-pay | Admitting: Surgery

## 2011-10-16 NOTE — Telephone Encounter (Signed)
10/16/11 mailed recall to patient for bariatric surgery follow-up. Adv pt to call [facility] to schedule an appt...cef (in order to comply with [facility] guidelines, pt needs to be seen on or prior to 11/03/11)

## 2011-11-02 ENCOUNTER — Ambulatory Visit (INDEPENDENT_AMBULATORY_CARE_PROVIDER_SITE_OTHER): Payer: Self-pay | Admitting: Surgery

## 2011-11-03 ENCOUNTER — Encounter: Payer: Self-pay | Admitting: Family Medicine

## 2011-11-03 ENCOUNTER — Ambulatory Visit (INDEPENDENT_AMBULATORY_CARE_PROVIDER_SITE_OTHER): Payer: BC Managed Care – PPO | Admitting: Family Medicine

## 2011-11-03 VITALS — BP 128/68 | HR 55 | Temp 98.3°F | Ht 65.0 in | Wt 177.5 lb

## 2011-11-03 DIAGNOSIS — J019 Acute sinusitis, unspecified: Secondary | ICD-10-CM | POA: Insufficient documentation

## 2011-11-03 MED ORDER — AMOXICILLIN-POT CLAVULANATE 875-125 MG PO TABS: 1.0000 | ORAL_TABLET | Freq: Two times a day (BID) | ORAL | Status: AC

## 2011-11-03 NOTE — Patient Instructions (Signed)
Please get the antibiotic I prescribed.  Continue your over the counter meds You only need to be seen again if worsening or failure to improve.

## 2011-11-03 NOTE — Progress Notes (Signed)
  Subjective:    Patient ID: Jacqueline Hayes, female    DOB: 28-Oct-1956, 55 y.o.   MRN: [id number]  HPI Patient with 7 day hx of resp illness with cough, nasal congestion, minimal ear pain, low grade fever.  Yesterday, had worsening with increase in facial pressure and discomfort    Review of Systems No high fever, shortness of breath. Not immunocompromised.    Objective:   Physical Exam TMs nl Throat cobblestoned. Tenderness to percussion of maxillary sinuses Rt>Lt Neck, shoddy ant cervical nodes. Lungs clear       Assessment & Plan:

## 2011-11-03 NOTE — Assessment & Plan Note (Signed)
Treat given exam and second sickening.

## 2011-11-15 ENCOUNTER — Telehealth: Payer: Self-pay | Admitting: Family Medicine

## 2011-11-15 NOTE — Telephone Encounter (Signed)
Would like to speak to someone about what to do for a stopped up ear.  She is leaving for the beach today.

## 2011-11-15 NOTE — Telephone Encounter (Signed)
Was treated for sinus infection a week ago  and that has cleared. On 12/22 her ear stopped up and has been stopped up ever since.  No tenderness, itches a little bit.  She feels fine otherwise.  Using saline solution nasally. Leaving for beach in one hour.

## 2011-11-15 NOTE — Telephone Encounter (Signed)
Sounds like eustacian tube dysfunction.  Will advise to use Afrin for no more than three consecutive days.

## 2011-11-23 ENCOUNTER — Encounter: Payer: Self-pay | Admitting: Family Medicine

## 2011-11-23 ENCOUNTER — Ambulatory Visit (INDEPENDENT_AMBULATORY_CARE_PROVIDER_SITE_OTHER): Payer: BC Managed Care – PPO | Admitting: Family Medicine

## 2011-11-23 DIAGNOSIS — H9190 Unspecified hearing loss, unspecified ear: Secondary | ICD-10-CM | POA: Insufficient documentation

## 2011-11-23 DIAGNOSIS — J019 Acute sinusitis, unspecified: Secondary | ICD-10-CM

## 2011-11-23 NOTE — Patient Instructions (Signed)
Take pseudoephedrine or phenylephrine (decongestants)  Will make ENT appointment for you for further evaluation

## 2011-11-23 NOTE — Progress Notes (Signed)
  Subjective:    Patient ID: YESSICA PUTNAM, female    DOB: 26-Jun-1956, 56 y.o.   MRN: [id number]  HPIHere for evaluation of right ear congestion x 2-3 weeks  Saw PCP on 12/14 for sinuitis was having ear congestion at that time as well.  Was rxed augmentin and took afrin as well as OTC cold medications.  Rhinorrhea and nasal congestion improved.  Continued to feel "stuffy" in right ear no improvement.  No ear pain, drainage,  Fever, dizziness, tinnitus.  Review of Systems     Objective:   Physical Exam  GEN; NAD Ears: bilateral TM wnl, right TM slightly more vascular than left but not generalized erythema.  No obvious effusion. No cerumen.   Audiometry shows decreased hearing on right.  Weber and Rinne suggest conductive hearing loss.      Assessment & Plan:

## 2011-11-23 NOTE — Assessment & Plan Note (Addendum)
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  No evidence of effusion or active infection.  (completed course of Augmentin)  Patient not open to trial of decongestant.  I have made a referral to ENT for further evaluation and management.

## 2011-11-24 ENCOUNTER — Ambulatory Visit: Payer: BC Managed Care – PPO | Admitting: Family Medicine

## 2011-12-01 ENCOUNTER — Encounter (HOSPITAL_COMMUNITY): Payer: Self-pay

## 2011-12-01 ENCOUNTER — Other Ambulatory Visit: Payer: Self-pay | Admitting: Family Medicine

## 2011-12-01 ENCOUNTER — Emergency Department (HOSPITAL_COMMUNITY): Payer: BC Managed Care – PPO

## 2011-12-01 ENCOUNTER — Emergency Department (HOSPITAL_COMMUNITY)
Admission: EM | Admit: 2011-12-01 | Discharge: 2011-12-01 | Disposition: A | Discharge status: Home / Self Care | Payer: BC Managed Care – PPO | Attending: Emergency Medicine | Admitting: Emergency Medicine

## 2011-12-01 DIAGNOSIS — Z79899 Other long term (current) drug therapy: Secondary | ICD-10-CM | POA: Insufficient documentation

## 2011-12-01 DIAGNOSIS — E039 Hypothyroidism, unspecified: Secondary | ICD-10-CM | POA: Insufficient documentation

## 2011-12-01 DIAGNOSIS — N201 Calculus of ureter: Secondary | ICD-10-CM | POA: Insufficient documentation

## 2011-12-01 DIAGNOSIS — R109 Unspecified abdominal pain: Secondary | ICD-10-CM | POA: Insufficient documentation

## 2011-12-01 DIAGNOSIS — I10 Essential (primary) hypertension: Secondary | ICD-10-CM | POA: Insufficient documentation

## 2011-12-01 LAB — URINE MICROSCOPIC-ADD ON

## 2011-12-01 LAB — URINALYSIS, ROUTINE W REFLEX MICROSCOPIC
Leukocytes, UA: NEGATIVE
Nitrite: NEGATIVE
Protein, ur: NEGATIVE mg/dL
Urobilinogen, UA: 0.2 mg/dL (ref 0.0–1.0)

## 2011-12-01 MED ORDER — ONDANSETRON HCL 4 MG/2ML IJ SOLN
4.0000 mg | Freq: Once | INTRAMUSCULAR | Status: AC
  Administered 2011-12-01: 4 mg via INTRAVENOUS

## 2011-12-01 MED ORDER — KETOROLAC TROMETHAMINE 30 MG/ML IJ SOLN
30.0000 mg | Freq: Once | INTRAMUSCULAR | Status: AC
  Administered 2011-12-01: 30 mg via INTRAVENOUS

## 2011-12-01 MED ORDER — SULFAMETHOXAZOLE-TRIMETHOPRIM 800-160 MG PO TABS: 1.0000 | ORAL_TABLET | Freq: Two times a day (BID) | ORAL | Status: AC

## 2011-12-01 MED ORDER — ONDANSETRON HCL 4 MG/2ML IJ SOLN
INTRAMUSCULAR | Status: AC
  Filled 2011-12-01: qty 2

## 2011-12-01 MED ORDER — OXYCODONE-ACETAMINOPHEN 5-325 MG PO TABS: 2.0000 | ORAL_TABLET | ORAL | Status: DC | PRN

## 2011-12-01 MED ORDER — KETOROLAC TROMETHAMINE 30 MG/ML IJ SOLN
INTRAMUSCULAR | Status: AC
  Filled 2011-12-01: qty 1

## 2011-12-01 MED ORDER — ONDANSETRON HCL 4 MG PO TABS: 8.0000 mg | ORAL_TABLET | Freq: Four times a day (QID) | ORAL | Status: DC

## 2011-12-01 MED ORDER — OXYCODONE-ACETAMINOPHEN 5-325 MG PO TABS: 1.0000 | ORAL_TABLET | ORAL | Status: AC | PRN

## 2011-12-01 MED ORDER — ONDANSETRON HCL 4 MG PO TABS: 8.0000 mg | ORAL_TABLET | Freq: Four times a day (QID) | ORAL | Status: AC

## 2011-12-01 NOTE — Telephone Encounter (Signed)
Refill request

## 2011-12-01 NOTE — ED Provider Notes (Signed)
History     CSN: [id number]  Arrival date & time 12/01/11  1138   First MD Initiated Contact with Patient 12/01/11 1211      Chief Complaint  Patient presents with  . Abdominal Pain    x this am. Feels heavy urge to have a BM and urinate at same time.  Esp LLQ/L flank  . Nausea    vomited x 1 from pain.    (Consider location/radiation/quality/duration/timing/severity/associated sxs/prior treatment) HPI  Patient with left sided pain began this a.m. At about 0800.  Noted some urinary urgency last night, then had rectal pressure, resolved during night with return of urgency this a.m.  Left lower back pain began this a.m. Sharp constant worsening through morning.  Became nauseated but worsens with sitting or standing.  Pain improving with toradol given here.  Pain at worse 10, now 3,     Past Medical History  Diagnosis Date  . Hypertension   . Thyroid disease     Past Surgical History  Procedure Date  . Gastric bypass 04/2008  . Tonsillectomy   . Foot surgery     as a child  . Nasal septum surgery     History reviewed. No pertinent family history.  History  Substance Use Topics  . Smoking status: Never Smoker   . Smokeless tobacco: Not on file  . Alcohol Use: No    OB History    Grav Para Term Preterm Abortions TAB SAB Ect Mult Living                  Review of Systems  All other systems reviewed and are negative.    Allergies  Review of patient's allergies indicates not on file.  Home Medications   Current Outpatient Rx  Name Route Sig Dispense Refill  . ATENOLOL 100 MG PO TABS Oral Take 100 mg by mouth daily.    Marland Kitchen DOXYCYCLINE MONOHYDRATE 100 MG PO TABS Oral Take 100 mg by mouth 2 (two) times daily. Pt's on this therapy for 21 days. Started on 11-24-2011 pt's on day 7 of therapy.    Marland Kitchen LEVOTHYROXINE SODIUM 112 MCG PO TABS Oral Take 112 mcg by mouth daily.    Marland Kitchen CHILDRENS CHEWABLE MULTI VITS PO Oral Take by mouth.    Marland Kitchen POTASSIUM CHLORIDE ER 10 MEQ PO TBCR  Oral Take 15 mEq by mouth daily. Pt takes 1 and 1/2 tablet for 15 meq    . PREDNISONE 10 MG PO KIT Oral Take 10 mg by mouth See admin instructions. 6 tabs x 6 days, 5 tabs x 5 days, 4 tabs x 4 days, 3 tabs x 3 days, 2 tabs x 2 days, 1 tab x 1 day. Finished prednisone on 11-30-11.    Marland Kitchen VITAMIN C 500 MG PO TABS Oral Take 500 mg by mouth daily.      BP 169/66  Pulse 55  Temp(Src) 97.8 F (36.6 C) (Oral)  Resp 18  Ht 5\' 5"  (1.651 m)  Wt 178 lb (80.74 kg)  BMI 29.62 kg/m2  SpO2 100%  Physical Exam  Nursing note and vitals reviewed. Constitutional: She is oriented to person, place, and time. She appears well-developed and well-nourished.  HENT:  Head: Normocephalic and atraumatic.  Right Ear: External ear normal.  Left Ear: External ear normal.  Nose: Nose normal.  Mouth/Throat: Oropharynx is clear and moist.  Eyes: Conjunctivae and EOM are normal. Pupils are equal, round, and reactive to light.  Neck: Normal range of motion.  Neck supple.  Cardiovascular: Normal rate, regular rhythm and normal heart sounds.   Pulmonary/Chest: Effort normal and breath sounds normal.  Abdominal: Soft. Bowel sounds are normal.  Musculoskeletal: Normal range of motion.  Neurological: She is alert and oriented to person, place, and time. She has normal reflexes.  Skin: Skin is warm and dry.  Psychiatric: She has a normal mood and affect.    ED Course  Procedures (including critical care time)  Labs Reviewed - No data to display No results found.   No diagnosis found.    MDM  Patient with many rbc in urine and pain relieved with toradol.  Awaiting ct results.    Patient with good pain control.    Left uvj stone at 3 mm with obstruction.      Hilario Quarry, MD 12/01/11 [phone]

## 2011-12-05 ENCOUNTER — Telehealth: Payer: Self-pay | Admitting: Family Medicine

## 2011-12-05 NOTE — Telephone Encounter (Signed)
Is asking about the Laneta Simmers - wants to know if this has been called in.  (in the meds list it says "no print")

## 2011-12-05 NOTE — Telephone Encounter (Signed)
Spoke with Larita Fife and told her the Rx for Lorazepam had been called in and is ready for her to pick it up at the [facility] in [place].  Ileana Ladd

## 2011-12-18 ENCOUNTER — Encounter: Payer: Self-pay | Admitting: Family Medicine

## 2011-12-25 ENCOUNTER — Other Ambulatory Visit: Payer: Self-pay | Admitting: Obstetrics and Gynecology

## 2011-12-25 DIAGNOSIS — Z1231 Encounter for screening mammogram for malignant neoplasm of breast: Secondary | ICD-10-CM

## 2012-01-29 ENCOUNTER — Ambulatory Visit
Admission: RE | Admit: 2012-01-29 | Discharge: 2012-01-29 | Disposition: A | Discharge status: Home / Self Care | Payer: BC Managed Care – PPO | Source: Ambulatory Visit | Attending: Obstetrics and Gynecology | Admitting: Obstetrics and Gynecology

## 2012-01-29 DIAGNOSIS — Z1231 Encounter for screening mammogram for malignant neoplasm of breast: Secondary | ICD-10-CM

## 2012-02-07 ENCOUNTER — Ambulatory Visit (INDEPENDENT_AMBULATORY_CARE_PROVIDER_SITE_OTHER): Payer: BC Managed Care – PPO | Admitting: Family Medicine

## 2012-02-07 ENCOUNTER — Encounter: Payer: Self-pay | Admitting: Family Medicine

## 2012-02-07 VITALS — BP 126/82 | HR 60 | Temp 98.2°F | Ht 65.0 in | Wt 178.0 lb

## 2012-02-07 DIAGNOSIS — J40 Bronchitis, not specified as acute or chronic: Secondary | ICD-10-CM

## 2012-02-07 MED ORDER — AZITHROMYCIN 500 MG PO TABS: 500.0000 mg | ORAL_TABLET | Freq: Every day | ORAL | Status: AC

## 2012-02-07 NOTE — Patient Instructions (Signed)
I sent an antibiotic prescription in for you. If you are not definitely improving in 10 days, see me (call first).

## 2012-02-08 NOTE — Progress Notes (Signed)
  Subjective:    Patient ID: Jacqueline Hayes, female    DOB: 08/24/1956, 56 y.o.   MRN: [id number]  HPI  Sick with respiratory infection for 6-8 weeks.  Already seen x 2.  No fever.  Still with cough.  Worrisomely, not improving.  Minimal nasal congestion.  No rhinorrhea.    Review of Systems     Objective:   Physical Exam TMs nl Throat nl Neck without sig nodes Lungs clear       Assessment & Plan:

## 2012-02-08 NOTE — Assessment & Plan Note (Signed)
Continued respiratory infection with cough.  Will treat empirically with antibiotics.  Will need further WU - CXR and TB skin test if fails to improve.

## 2012-03-25 ENCOUNTER — Encounter: Payer: Self-pay | Admitting: Family Medicine

## 2012-03-25 DIAGNOSIS — Z9884 Bariatric surgery status: Secondary | ICD-10-CM | POA: Insufficient documentation

## 2012-05-24 ENCOUNTER — Other Ambulatory Visit: Payer: Self-pay | Admitting: Family Medicine

## 2012-05-24 DIAGNOSIS — I10 Essential (primary) hypertension: Secondary | ICD-10-CM

## 2012-06-13 ENCOUNTER — Ambulatory Visit (INDEPENDENT_AMBULATORY_CARE_PROVIDER_SITE_OTHER): Payer: BC Managed Care – PPO | Admitting: Family Medicine

## 2012-06-13 ENCOUNTER — Encounter: Payer: Self-pay | Admitting: Family Medicine

## 2012-06-13 VITALS — BP 106/72 | HR 64 | Temp 98.1°F | Ht 65.0 in | Wt 180.7 lb

## 2012-06-13 DIAGNOSIS — F419 Anxiety disorder, unspecified: Secondary | ICD-10-CM

## 2012-06-13 DIAGNOSIS — I10 Essential (primary) hypertension: Secondary | ICD-10-CM

## 2012-06-13 DIAGNOSIS — Z9884 Bariatric surgery status: Secondary | ICD-10-CM

## 2012-06-13 DIAGNOSIS — Z0000 Encounter for general adult medical examination without abnormal findings: Secondary | ICD-10-CM

## 2012-06-13 DIAGNOSIS — F411 Generalized anxiety disorder: Secondary | ICD-10-CM

## 2012-06-13 DIAGNOSIS — E039 Hypothyroidism, unspecified: Secondary | ICD-10-CM

## 2012-06-13 LAB — COMPREHENSIVE METABOLIC PANEL
ALT: 12 U/L (ref 0–35)
Alkaline Phosphatase: 95 U/L (ref 39–117)
Sodium: 142 mEq/L (ref 135–145)
Total Bilirubin: 0.4 mg/dL (ref 0.3–1.2)
Total Protein: 6 g/dL (ref 6.0–8.3)

## 2012-06-13 LAB — LIPID PANEL
LDL Cholesterol: 104 mg/dL — ABNORMAL HIGH (ref 0–99)
Triglycerides: 135 mg/dL (ref <150)
VLDL: 27 mg/dL (ref 0–40)

## 2012-06-13 LAB — TSH: TSH: 3.281 u[IU]/mL (ref 0.350–4.500)

## 2012-06-13 LAB — CBC
MCH: 30.5 pg (ref 26.0–34.0)
MCHC: 34.8 g/dL (ref 30.0–36.0)
Platelets: 150 10*3/uL (ref 150–400)
RDW: 13.4 % (ref 11.5–15.5)

## 2012-06-13 MED ORDER — ATENOLOL 100 MG PO TABS: 100.0000 mg | ORAL_TABLET | Freq: Every day | ORAL | Status: DC

## 2012-06-13 MED ORDER — POTASSIUM CHLORIDE ER 10 MEQ PO TBCR: 15.0000 meq | EXTENDED_RELEASE_TABLET | Freq: Every day | ORAL | Status: DC

## 2012-06-13 MED ORDER — LEVOTHYROXINE SODIUM 112 MCG PO TABS: 112.0000 ug | ORAL_TABLET | Freq: Every day | ORAL | Status: DC

## 2012-06-13 MED ORDER — LORAZEPAM 1 MG PO TABS: 1.0000 mg | ORAL_TABLET | Freq: Every day | ORAL | Status: DC

## 2012-06-13 NOTE — Patient Instructions (Addendum)
Cut your atenolol in 1/2 and monitor your blood pressure. Let me know after a couple of months so that I can update your records. Also let me know the date of you next pap smear Ask about doing pap smears ever three years.

## 2012-06-13 NOTE — Assessment & Plan Note (Signed)
Very well controled.  Will try decrease atenolol

## 2012-06-17 ENCOUNTER — Encounter: Payer: Self-pay | Admitting: Family Medicine

## 2012-06-18 DIAGNOSIS — Z0000 Encounter for general adult medical examination without abnormal findings: Secondary | ICD-10-CM | POA: Insufficient documentation

## 2012-06-18 NOTE — Assessment & Plan Note (Signed)
Overall, very healthy since bariatric surg.

## 2012-06-18 NOTE — Assessment & Plan Note (Signed)
Blood work

## 2012-06-18 NOTE — Assessment & Plan Note (Signed)
Well controled on lorazepam

## 2012-06-18 NOTE — Progress Notes (Signed)
  Subjective:    Patient ID: Jacqueline Hayes, female    DOB: 05/17/1956, 56 y.o.   MRN: [id number]  HPI  Annual exam.  Gets pap and mammo by gyn. Feels great since bariatric surg.  Has lost about 90 lbs total. Hypertension has been well controled by home readings. Needs three month refills on meds Up to date on health maint No unhealthy life style issues    Review of Systems Denies CP, SOB, syncope, swelling or bleeding.  Denies bowel, bladder or weight changes.     Objective:   Physical ExamHEENT normal Neck supple without thyromegally Lungs clear Cardiac RRR without m or g Abd benign Ext no edema        Assessment & Plan:

## 2012-11-25 ENCOUNTER — Telehealth (INDEPENDENT_AMBULATORY_CARE_PROVIDER_SITE_OTHER): Payer: Self-pay | Admitting: Surgery

## 2012-11-25 NOTE — Telephone Encounter (Signed)
11/25/12 mailed bariatric surgery recall letter to patient. Advised patient to call [phone] to schedule a bariatric follow-up appt. ls

## 2012-12-23 ENCOUNTER — Other Ambulatory Visit: Payer: Self-pay | Admitting: Obstetrics and Gynecology

## 2012-12-23 DIAGNOSIS — Z1231 Encounter for screening mammogram for malignant neoplasm of breast: Secondary | ICD-10-CM

## 2013-01-10 DIAGNOSIS — H2510 Age-related nuclear cataract, unspecified eye: Secondary | ICD-10-CM | POA: Insufficient documentation

## 2013-01-13 ENCOUNTER — Encounter: Payer: Self-pay | Admitting: Family Medicine

## 2013-01-13 NOTE — Progress Notes (Signed)
Patient ID: Jacqueline Hayes, female   DOB: 1956-07-03, 57 y.o.   MRN: [id number] Ophthal records update Fuchs.  See scanned report for details.

## 2013-01-29 ENCOUNTER — Ambulatory Visit
Admission: RE | Admit: 2013-01-29 | Discharge: 2013-01-29 | Disposition: A | Discharge status: Home / Self Care | Payer: BC Managed Care – PPO | Source: Ambulatory Visit | Attending: Obstetrics and Gynecology | Admitting: Obstetrics and Gynecology

## 2013-01-29 DIAGNOSIS — Z1231 Encounter for screening mammogram for malignant neoplasm of breast: Secondary | ICD-10-CM

## 2013-04-07 ENCOUNTER — Other Ambulatory Visit: Payer: Self-pay | Admitting: *Deleted

## 2013-04-07 DIAGNOSIS — E039 Hypothyroidism, unspecified: Secondary | ICD-10-CM

## 2013-04-07 MED ORDER — LEVOTHYROXINE SODIUM 112 MCG PO TABS: 112.0000 ug | ORAL_TABLET | Freq: Every day | ORAL | Status: DC

## 2013-04-07 NOTE — Telephone Encounter (Signed)
Requested Prescriptions   Pending Prescriptions Disp Refills  . levothyroxine (SYNTHROID, LEVOTHROID) 112 MCG tablet 90 tablet 3    Sig: Take 1 tablet (112 mcg total) by mouth daily.   Pharmacy notes that they have changed manufactures - is it ok to substitute for pt? Wyatt Haste, RN-BSN

## 2013-04-21 ENCOUNTER — Telehealth: Payer: Self-pay | Admitting: Family Medicine

## 2013-04-21 NOTE — Telephone Encounter (Signed)
Agree with approving switch.

## 2013-04-21 NOTE — Telephone Encounter (Signed)
Pt called. [facility] pharamcy states levothyroxine is changing and it will be very expensive to refill. Wants to have another brand. Needs drs approval to change the brand. Please contact husband Harvie Heck. Pt is at work

## 2013-04-21 NOTE — Telephone Encounter (Signed)
Spoke with pharmacist at [facility] and they are switching the manufacturer for levothyroxine and needed approval to do so for patient.  Switch approved.  Pt's husband notified.  Sukhman Kocher, Darlyne Russian, CMA

## 2013-06-13 ENCOUNTER — Other Ambulatory Visit: Payer: Self-pay | Admitting: Family Medicine

## 2013-06-13 NOTE — Telephone Encounter (Signed)
Patient has an appt on 8/15.

## 2013-07-04 ENCOUNTER — Ambulatory Visit (INDEPENDENT_AMBULATORY_CARE_PROVIDER_SITE_OTHER): Payer: BC Managed Care – PPO | Admitting: Family Medicine

## 2013-07-04 ENCOUNTER — Encounter: Payer: Self-pay | Admitting: Family Medicine

## 2013-07-04 VITALS — BP 137/63 | HR 47 | Temp 98.1°F | Ht 64.0 in | Wt 184.2 lb

## 2013-07-04 DIAGNOSIS — I10 Essential (primary) hypertension: Secondary | ICD-10-CM

## 2013-07-04 DIAGNOSIS — E039 Hypothyroidism, unspecified: Secondary | ICD-10-CM

## 2013-07-04 DIAGNOSIS — Z9884 Bariatric surgery status: Secondary | ICD-10-CM

## 2013-07-04 DIAGNOSIS — Z0000 Encounter for general adult medical examination without abnormal findings: Secondary | ICD-10-CM

## 2013-07-04 LAB — CBC
HCT: 39.5 % (ref 36.0–46.0)
MCH: 29.7 pg (ref 26.0–34.0)
MCHC: 33.7 g/dL (ref 30.0–36.0)
RDW: 14 % (ref 11.5–15.5)

## 2013-07-04 MED ORDER — VALSARTAN 80 MG PO TABS: 80.0000 mg | ORAL_TABLET | Freq: Every day | ORAL | Status: DC

## 2013-07-04 NOTE — Assessment & Plan Note (Signed)
Check labs 

## 2013-07-04 NOTE — Assessment & Plan Note (Signed)
Very healthy 

## 2013-07-04 NOTE — Patient Instructions (Addendum)
Talk to Dr. Verlee Monte about Paps every three years and quitting at age 57 You will be due for another colonoscopy in 2016. If you sign up for MyChart, you will be able to check much of this stuff on line. For one week take 1/2 tab of atenolol daily then quit. You will need another blood test in one week. I will call with lab results.

## 2013-07-04 NOTE — Assessment & Plan Note (Signed)
Check B12 

## 2013-07-04 NOTE — Progress Notes (Signed)
  Subjective:    Patient ID: Jacqueline Hayes, female    DOB: [birth year], 56 y.o.   MRN: [id number]  HPI Feels good.  Still working.  We discussed and she is still leery of flu vaccine.  Otherwise up to date on HPDP.  Sees Dr. Verlee Monte for annual pap and pelvic.  Is S/P gastric bypass.   She is up to date on other HPDP measures. She sees her gastric bypass as life changing.  Still has some joint pains but can live with them    Review of Systems Denies Chest pain, SOB, bleeding, wt change, stool change, skin lesions.     Objective:   Physical Exam HEENT nl Neck supple without nodes Lungs clear Cardiac RRR without m or g Abd benign Ext no edema       Assessment & Plan:

## 2013-07-05 LAB — COMPLETE METABOLIC PANEL WITH GFR
AST: 14 U/L (ref 0–37)
Albumin: 4.1 g/dL (ref 3.5–5.2)
Alkaline Phosphatase: 120 U/L — ABNORMAL HIGH (ref 39–117)
BUN: 15 mg/dL (ref 6–23)
Potassium: 4.1 mEq/L (ref 3.5–5.3)
Total Bilirubin: 0.4 mg/dL (ref 0.3–1.2)

## 2013-07-05 LAB — VITAMIN B12: Vitamin B-12: >2000 pg/mL — ABNORMAL HIGH (ref 211–911)

## 2013-07-05 LAB — LIPID PANEL
Cholesterol: 209 mg/dL — ABNORMAL HIGH (ref 0–200)
HDL: 67 mg/dL (ref >39)
Total CHOL/HDL Ratio: 3.1 Ratio
Triglycerides: 142 mg/dL (ref <150)

## 2013-07-05 LAB — TSH: TSH: 5.45 u[IU]/mL — ABNORMAL HIGH (ref 0.350–4.500)

## 2013-07-07 ENCOUNTER — Encounter: Payer: Self-pay | Admitting: Family Medicine

## 2013-07-11 ENCOUNTER — Other Ambulatory Visit: Payer: BC Managed Care – PPO

## 2013-07-11 DIAGNOSIS — I10 Essential (primary) hypertension: Secondary | ICD-10-CM

## 2013-07-11 LAB — BASIC METABOLIC PANEL
BUN: 21 mg/dL (ref 6–23)
Calcium: 8.9 mg/dL (ref 8.4–10.5)
Creat: 0.56 mg/dL (ref 0.50–1.10)
Glucose, Bld: 118 mg/dL — ABNORMAL HIGH (ref 70–99)
Sodium: 142 mEq/L (ref 135–145)

## 2013-07-11 NOTE — Progress Notes (Signed)
BMP DONE TODAY Madhavi Hamblen 

## 2013-07-14 ENCOUNTER — Ambulatory Visit (HOSPITAL_COMMUNITY)
Admission: RE | Admit: 2013-07-14 | Discharge: 2013-07-14 | Disposition: A | Discharge status: Home / Self Care | Payer: BC Managed Care – PPO | Source: Ambulatory Visit | Attending: Family Medicine | Admitting: Family Medicine

## 2013-07-14 ENCOUNTER — Telehealth: Payer: Self-pay | Admitting: Family Medicine

## 2013-07-14 ENCOUNTER — Ambulatory Visit (INDEPENDENT_AMBULATORY_CARE_PROVIDER_SITE_OTHER): Payer: BC Managed Care – PPO | Admitting: Family Medicine

## 2013-07-14 ENCOUNTER — Encounter: Payer: Self-pay | Admitting: Family Medicine

## 2013-07-14 VITALS — BP 135/88 | HR 70 | Temp 97.8°F | Wt 188.0 lb

## 2013-07-14 DIAGNOSIS — Z87898 Personal history of other specified conditions: Secondary | ICD-10-CM

## 2013-07-14 DIAGNOSIS — I498 Other specified cardiac arrhythmias: Secondary | ICD-10-CM

## 2013-07-14 DIAGNOSIS — I10 Essential (primary) hypertension: Secondary | ICD-10-CM

## 2013-07-14 MED ORDER — ATENOLOL 50 MG PO TABS: 50.0000 mg | ORAL_TABLET | Freq: Every evening | ORAL | Status: DC

## 2013-07-14 NOTE — Progress Notes (Signed)
Patient ID: Jacqueline Hayes, female   DOB: [birth year], 57 y.o.   MRN: [id number] Subjective:   CC: Follow up BP medication change  HPI:   1. Follow-up BP medication change - Pt is here for sameday appointment with elevated BP, anxiety, and SOB since medication change. She reports 9 days ago ending a taper off of atenolol (100mg  daily-->50mg  daily x 1 week --> off). She was recently started on valsartan and atenolol taper begun ~2 weeks ago due to bradycardia with atenolol. Since that time, she has felt "not good" with BP at home measuring in 140-160s/80-90s when baseline on atenolol had been 110s/60s. She reports "heart pounding", feeling "trembly," headaches, and some arm numbness. She wonders if it has to do with stopping atenolol.   Review of Systems - Per HPI.   SH:  - Drives schoolbus  FH: - FH of DM in 1st degree relatives, though pt does not have    Objective:  Physical Exam BP 135/88  Pulse 70  Temp(Src) 97.8 F (36.6 C) (Oral)  Wt 188 lb (85.276 kg)  BMI 32.25 kg/m2  SpO2 100% GEN: NAD HEENT: Atraumatic, normocephalic, neck supple, EOMI, sclera clear  CV: RRR, no murmurs, rubs, or gallops PULM: CTAB, normal effort ABD: Soft, nontender, nondistended SKIN: No rash or cyanosis; warm and well-perfused PSYCH: Mood and affect mildly anxious-appearing, normal rate and volume of speech NEURO: Awake, alert, no focal deficits grossly, normal speech    Assessment:     Jacqueline Hayes is a 57 y.o. female with h/o HTN and recenlty changed medications here for follow-up with symptoms concerning to pt since medication change.    Plan:     # See problem list for problem-specific plans.

## 2013-07-14 NOTE — Telephone Encounter (Signed)
Pt reports that she came in with low pulse at last visit on aug 15 and bp was perfect. Dr. Leveda Anna took her off of Atenolol  - take 50 mg of Atenolol and valstartan 80 mg for a week. After taking last Atenolol , bp has been high ( 162/90) and heart feels like it is racing and she is having shortness of breath. Appointment made for today at 345. Wyatt Haste, RN-BSN

## 2013-07-14 NOTE — Patient Instructions (Addendum)
EKG was normal today.  I am restarting atenolol at 50mg  which you can take in the evenings/before bedtime. Return in 2 weeks to follow up with Dr. Leveda Anna.  If you develop chest pain, shortness of breath, dizziness, fainting, or other concerns, seek immediate care.    Atenolol tablets What is this medicine? ATENOLOL (a TEN oh lole) is a beta-blocker. Beta-blockers reduce the workload on the heart and help it to beat more regularly. This medicine is used to treat high blood pressure and to prevent chest pain. It is also used to protect the heart during a heart attack and to prevent an additional heart attack from occurring. This medicine may be used for other purposes; ask your health care provider or pharmacist if you have questions. What should I tell my health care provider before I take this medicine? They need to know if you have any of these conditions: -diabetes -heart or vessel disease like slow heart rate, worsening heart failure, heart block, sick sinus syndrome or Raynaud's disease -kidney disease -lung or breathing disease, like asthma or emphysema -pheochromocytoma -thyroid disease -an unusual or allergic reaction to atenolol, other beta-blockers, medicines, foods, dyes, or preservatives -pregnant or trying to get pregnant -breast-feeding How should I use this medicine? Take this medicine by mouth with a drink of water. Follow the directions on the prescription label. This medicine may be taken with or without food. Take your medicine at regular intervals. Do not take more medicine than directed. Do not stop taking this medicine suddenly. This could lead to serious heart-related effects. Talk to your pediatrician regarding the use of this medicine in children. Special care may be needed. Overdosage: If you think you have taken too much of this medicine contact a poison control center or emergency room at once. NOTE: This medicine is only for you. Do not share this medicine with  others. What if I miss a dose? If you miss a dose, take it as soon as you can. If it is almost time for your next dose, take only that dose. Do not take double or extra doses. What may interact with this medicine? Do not take this medicine with any of the following medications: -sotalol This medicine may also interact with the following medications: -clonidine -digoxin -diuretics -dobutamine -epinephrine -isoproterenol -medicine for blood pressure like amlodipine, diltiazem, verapamil -NSAIDs, medicines for pain and inflammation, like ibuprofen or naproxen -reserpine This list may not describe all possible interactions. Give your health care provider a list of all the medicines, herbs, non-prescription drugs, or dietary supplements you use. Also tell them if you smoke, drink alcohol, or use illegal drugs. Some items may interact with your medicine. What should I watch for while using this medicine? Visit your doctor or health care professional for regular check ups. Check your blood pressure and pulse rate regularly. Ask your health care professional what your blood pressure and pulse rate should be, and when you should contact him or her. You may get drowsy or dizzy. Do not drive, use machinery, or do anything that needs mental alertness until you know how this medicine affects you. Do not stand or sit up quickly. Alcohol may interfere with the effect of this medicine. Avoid alcoholic drinks. This medicine can affect blood sugar levels. If you have diabetes, check with your doctor or health care professional before you change your diet or the dose of your diabetic medicine. Do not treat yourself for coughs, colds, or pain while you are taking this medicine without  asking your doctor or health care professional for advice. Some ingredients may increase your blood pressure. What side effects may I notice from receiving this medicine? Side effects that you should report to your doctor or health  care professional as soon as possible: -allergic reactions like skin rash, itching or hives, swelling of the face, lips, or tongue -breathing problems -changes in vision -chest pain -cold, tingling, or numb hands or feet -depression -fast, irregular heartbeat -feeling faint or lightheaded, falls -fever with sore throat -rapid weight gain -swollen ankles, legs Side effects that usually do not require medical attention (report to your doctor or health care professional if they continue or are bothersome): -anxiety, nervous -diarrhea -dry skin -change in sex drive or performance -headache -nightmares or trouble sleeping -short term memory loss -stomach upset -unusually tired This list may not describe all possible side effects. Call your doctor for medical advice about side effects. You may report side effects to FDA at 1-800-FDA-1088. Where should I keep my medicine? Keep out of the reach of children. Store at room temperature between 20 and 25 degrees C (68 and 77 degrees F). Close tightly and protect from light. Throw away any unused medicine after the expiration date. NOTE: This sheet is a summary. It may not cover all possible information. If you have questions about this medicine, talk to your doctor, pharmacist, or health care provider.  2012, Elsevier/Gold Standard. (01/29/2008 10:00:11 AM)

## 2013-07-14 NOTE — Assessment & Plan Note (Addendum)
Pt is concerned by symptoms (ha, chest pounding, relatively higher BP) since changing from atenolol to valsartan 1-2 weeks ago. Change was due to bradycardia, though it appears pt had been bradycardic for some years to upper 40s bpm, likely asymptomatic bradycardia, with good BP control during that time. Pt's current symptoms may also be related to her anxiety or she is very sensitive to current BP increase over her previous baseline. - EKG today since none recently - WNL with no heart block - Restart atenolol 50mg  daily (half of previous dose) to see if this helps bring BP closer to pt's baseline of <120/60-70, where she was asymptomatic. Continue valsartan. If BP or pulse too low or symptomatic, stop and let us know. - F/u with PCP in 2 weeks. - Return precautions reviewed - Discussed plan with preceptor Dr. Mauricio Po

## 2013-07-19 ENCOUNTER — Encounter: Payer: Self-pay | Admitting: Family Medicine

## 2013-07-23 ENCOUNTER — Other Ambulatory Visit: Payer: Self-pay | Admitting: Family Medicine

## 2013-07-23 MED ORDER — LEVOTHYROXINE SODIUM 125 MCG PO TABS: 125.0000 ug | ORAL_TABLET | Freq: Every day | ORAL | Status: DC

## 2013-07-30 ENCOUNTER — Ambulatory Visit (INDEPENDENT_AMBULATORY_CARE_PROVIDER_SITE_OTHER): Payer: BC Managed Care – PPO | Admitting: Family Medicine

## 2013-07-30 ENCOUNTER — Encounter: Payer: Self-pay | Admitting: Family Medicine

## 2013-07-30 VITALS — BP 138/65 | HR 58 | Temp 99.1°F | Ht 65.0 in | Wt 186.0 lb

## 2013-07-30 DIAGNOSIS — E039 Hypothyroidism, unspecified: Secondary | ICD-10-CM

## 2013-07-30 DIAGNOSIS — I10 Essential (primary) hypertension: Secondary | ICD-10-CM

## 2013-07-30 NOTE — Patient Instructions (Addendum)
Please get your thyroid blood test any time mid Oct or later.  Call the day before and tell them you are coming in.  I will call with the lab results.   If everything is good on the TSH and with your symptoms and blood pressure, I will see you in a year.  Sooner if problems.

## 2013-07-31 NOTE — Progress Notes (Signed)
  Subjective:    Patient ID: Jacqueline Hayes, female    DOB: 08/15/1956, 57 y.o.   MRN: [id number]  HPI  Patient returns for follow up.  She did not tolerate ARB - although it is unclear to me how much of her symptoms were due to ARB versus how much of her symptoms were due to beta blocker withdrawal.  Regardless, she is back on atenolol at 50 mg daily and feeling great.  The switch was made primarily because of bradycardia which is still present but less.  Confounding the issue was prehaps some hypothyroidism.  She had a slightly high TSH, which I would have generally ignored but she also had this bradycardia and she complains of significant hair loss.  Because of the combination of high TSH and symptoms, I increased her levothyroxine dose.      Review of Systems     Objective:   Physical Exam Pulse = 65 BP noted Lungs clear. Cardiac RRR without m or g       Assessment & Plan:

## 2013-07-31 NOTE — Assessment & Plan Note (Signed)
Doing well on current meds.  Hopefully, she will have manageable brady on lower dose of atenolol

## 2013-07-31 NOTE — Assessment & Plan Note (Signed)
Recheck TSH in 6 weeks on higher dose 

## 2013-09-11 ENCOUNTER — Other Ambulatory Visit: Payer: BC Managed Care – PPO

## 2013-09-11 DIAGNOSIS — E039 Hypothyroidism, unspecified: Secondary | ICD-10-CM

## 2013-09-11 NOTE — Progress Notes (Signed)
Drew TSH   Dewitt Hoes, MLS

## 2013-09-12 LAB — TSH: TSH: 1.494 u[IU]/mL (ref 0.350–4.500)

## 2013-09-15 ENCOUNTER — Other Ambulatory Visit: Payer: Self-pay | Admitting: Family Medicine

## 2013-09-25 ENCOUNTER — Other Ambulatory Visit: Payer: Self-pay

## 2013-12-24 ENCOUNTER — Other Ambulatory Visit: Payer: Self-pay

## 2013-12-24 DIAGNOSIS — Z1231 Encounter for screening mammogram for malignant neoplasm of breast: Secondary | ICD-10-CM

## 2013-12-28 ENCOUNTER — Encounter: Payer: Self-pay | Admitting: Family Medicine

## 2013-12-29 NOTE — Progress Notes (Signed)
Patient calls, has been sending MyChart emails to Dr. Leveda AnnaHensel but would like to talk to him regarding changing her BP meds. She states she is at home now, and is available to talk now.

## 2013-12-30 MED ORDER — AMLODIPINE BESYLATE 5 MG PO TABS: 5.0000 mg | ORAL_TABLET | Freq: Every day | ORAL | Status: DC

## 2013-12-30 NOTE — Telephone Encounter (Signed)
See reply to patient.

## 2014-02-03 ENCOUNTER — Ambulatory Visit: Payer: Self-pay

## 2014-02-04 ENCOUNTER — Ambulatory Visit
Admission: RE | Admit: 2014-02-04 | Discharge: 2014-02-04 | Disposition: A | Discharge status: Home / Self Care | Payer: BC Managed Care – PPO | Source: Ambulatory Visit

## 2014-02-04 DIAGNOSIS — Z1231 Encounter for screening mammogram for malignant neoplasm of breast: Secondary | ICD-10-CM

## 2014-02-26 ENCOUNTER — Encounter: Payer: Self-pay | Admitting: Family Medicine

## 2014-03-20 ENCOUNTER — Other Ambulatory Visit: Payer: Self-pay | Admitting: Family Medicine

## 2014-04-22 ENCOUNTER — Other Ambulatory Visit: Payer: Self-pay | Admitting: Family Medicine

## 2014-06-30 ENCOUNTER — Other Ambulatory Visit: Payer: Self-pay | Admitting: Family Medicine

## 2014-07-29 ENCOUNTER — Encounter: Payer: Self-pay | Admitting: Internal Medicine

## 2014-07-31 ENCOUNTER — Encounter: Payer: Self-pay | Admitting: Family Medicine

## 2014-07-31 ENCOUNTER — Ambulatory Visit (INDEPENDENT_AMBULATORY_CARE_PROVIDER_SITE_OTHER): Payer: BC Managed Care – PPO | Admitting: Family Medicine

## 2014-07-31 VITALS — BP 137/72 | HR 64 | Temp 97.9°F | Ht 65.0 in | Wt 188.8 lb

## 2014-07-31 DIAGNOSIS — Z0000 Encounter for general adult medical examination without abnormal findings: Secondary | ICD-10-CM

## 2014-07-31 DIAGNOSIS — I10 Essential (primary) hypertension: Secondary | ICD-10-CM

## 2014-07-31 DIAGNOSIS — E039 Hypothyroidism, unspecified: Secondary | ICD-10-CM

## 2014-07-31 DIAGNOSIS — E669 Obesity, unspecified: Secondary | ICD-10-CM

## 2014-07-31 MED ORDER — VALSARTAN 80 MG PO TABS: 80.0000 mg | ORAL_TABLET | Freq: Every day | ORAL | Status: DC

## 2014-07-31 NOTE — Assessment & Plan Note (Signed)
Check TSH 

## 2014-07-31 NOTE — Progress Notes (Signed)
   Subjective:    Patient ID: Jacqueline Hayes, female    DOB: 11/02/1956, 58 y.o.   MRN: [id number]  HPI Annual physical    Retired last spring. 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  We discussed end of life care.  As a result of that discussion, she will contact her mother's physicians, inform them of desire for comfort care only and ask for a referral to hospice. Refuses flu shot despite my attempts to convince.  Otherwise up to date but will need several things next year at age 44. BP home reading borderline high systolics.  Having leg swelling ever since started the amlodipine. She is taking her vitamins S/P bariatric surgery.  Previous labs OK.   She has no complaints. Healthy lifestyle - especially eating.  She knows she can exercise more.  No unsafe behavior.    Review of Systems ROS neg.     Objective:   Physical ExamHEENT normal Neck supple without masses Lungs clear Cardiac RRR without m or g Abd benign Ext 2+ edema bilaterally Neuro grossly normal.        Assessment & Plan:

## 2014-07-31 NOTE — Assessment & Plan Note (Signed)
Healthy lifestyle and chronic problems controled.  Up to date on prevention and screening.  She knows she needs to lose weight.  She is optimistic that she will be able to be more active when less leg swelling (see hypertension.)

## 2014-07-31 NOTE — Patient Instructions (Addendum)
Next year will be a big year for you. You will need another colonoscopy. You will need a shingles vaccine. The pneumonia shots (now two) hit when you turn 63. Stop the amlodipine because of the swelling. Restart the valsartan. Get your blood checked in 2 weeks.  We may be able to stop the potassium I will call with blood work results. Remember to talk to your mom's docs about hospice.

## 2014-07-31 NOTE — Assessment & Plan Note (Signed)
Amlodipine causing leg swelling.  Switch amlodipine to valsartan.  She has at home.  She was symptomatic from cessation of beta blocker at the time she previously quit the valsartan.

## 2014-08-01 LAB — COMPREHENSIVE METABOLIC PANEL
ALBUMIN: 4.1 g/dL (ref 3.5–5.2)
ALT: 12 U/L (ref 0–35)
AST: 15 U/L (ref 0–37)
Alkaline Phosphatase: 118 U/L — ABNORMAL HIGH (ref 39–117)
BUN: 18 mg/dL (ref 6–23)
CO2: 28 mEq/L (ref 19–32)
Calcium: 9 mg/dL (ref 8.4–10.5)
Chloride: 105 mEq/L (ref 96–112)
Creat: 0.57 mg/dL (ref 0.50–1.10)
GLUCOSE: 82 mg/dL (ref 70–99)
Potassium: 3.8 mEq/L (ref 3.5–5.3)
SODIUM: 141 meq/L (ref 135–145)
TOTAL PROTEIN: 6.1 g/dL (ref 6.0–8.3)
Total Bilirubin: 0.4 mg/dL (ref 0.2–1.2)

## 2014-08-01 LAB — LIPID PANEL
Cholesterol: 198 mg/dL (ref 0–200)
HDL: 69 mg/dL (ref >39)
LDL CALC: 104 mg/dL — AB (ref 0–99)
Total CHOL/HDL Ratio: 2.9 Ratio
Triglycerides: 125 mg/dL (ref <150)
VLDL: 25 mg/dL (ref 0–40)

## 2014-08-01 LAB — TSH: TSH: 0.78 u[IU]/mL (ref 0.350–4.500)

## 2014-08-03 ENCOUNTER — Encounter: Payer: Self-pay | Admitting: Family Medicine

## 2014-08-21 ENCOUNTER — Ambulatory Visit (INDEPENDENT_AMBULATORY_CARE_PROVIDER_SITE_OTHER): Payer: BC Managed Care – PPO | Admitting: *Deleted

## 2014-08-21 VITALS — BP 142/88 | HR 57

## 2014-08-21 DIAGNOSIS — Z136 Encounter for screening for cardiovascular disorders: Secondary | ICD-10-CM

## 2014-08-21 DIAGNOSIS — Z0130 Encounter for examination of blood pressure without abnormal findings: Secondary | ICD-10-CM

## 2014-08-21 DIAGNOSIS — I10 Essential (primary) hypertension: Secondary | ICD-10-CM

## 2014-08-21 LAB — BASIC METABOLIC PANEL
BUN: 16 mg/dL (ref 6–23)
CHLORIDE: 106 meq/L (ref 96–112)
CO2: 25 mEq/L (ref 19–32)
Calcium: 8.8 mg/dL (ref 8.4–10.5)
Creat: 0.58 mg/dL (ref 0.50–1.10)
Glucose, Bld: 81 mg/dL (ref 70–99)
POTASSIUM: 3.6 meq/L (ref 3.5–5.3)
SODIUM: 141 meq/L (ref 135–145)

## 2014-08-21 NOTE — Progress Notes (Signed)
   Pt in nurse clinic for blood pressure check.  BP 142/88 manually, heart rate 57. Pt stated she took medications as prescribed.  Denies any chest pain, SOB, headache or visual changes.  Will forward to PCP.  Clovis PuMartin, Tamika L, RN

## 2014-10-07 ENCOUNTER — Other Ambulatory Visit: Payer: Self-pay | Admitting: Family Medicine

## 2014-10-07 DIAGNOSIS — I10 Essential (primary) hypertension: Secondary | ICD-10-CM

## 2014-10-07 MED ORDER — VALSARTAN 80 MG PO TABS: 80.0000 mg | ORAL_TABLET | Freq: Every day | ORAL | Status: DC

## 2014-10-07 NOTE — Telephone Encounter (Signed)
Refill request for Valsartan 80 mg, 90 day supply. Please send to [facility] in [place], [place].

## 2015-01-01 ENCOUNTER — Other Ambulatory Visit: Payer: Self-pay

## 2015-01-01 DIAGNOSIS — Z1231 Encounter for screening mammogram for malignant neoplasm of breast: Secondary | ICD-10-CM

## 2015-01-23 ENCOUNTER — Encounter: Payer: Self-pay | Admitting: Family Medicine

## 2015-01-29 ENCOUNTER — Encounter: Payer: Self-pay | Admitting: Family Medicine

## 2015-01-29 ENCOUNTER — Encounter: Payer: Self-pay | Admitting: Internal Medicine

## 2015-01-29 ENCOUNTER — Ambulatory Visit (INDEPENDENT_AMBULATORY_CARE_PROVIDER_SITE_OTHER): Payer: BC Managed Care – PPO | Admitting: Family Medicine

## 2015-01-29 VITALS — BP 132/71 | HR 63 | Temp 98.1°F | Ht 65.0 in | Wt 195.6 lb

## 2015-01-29 DIAGNOSIS — L304 Erythema intertrigo: Secondary | ICD-10-CM | POA: Diagnosis not present

## 2015-01-29 NOTE — Patient Instructions (Signed)
Google the term "intertrigo" about the irritation where the skin overlap.  Sometimes it's just the moisture.  Sometimes it is a yeast infection.  Sometimes it is just inflammation. The cream you have is a steroid cream to calm inflammation. You can get miconizole nitrate 2% cream over the counter for yeast/fungus infection. You are doing the right thing with the vasoline - it is best for you to figure out what the best remedy for your intertigo. I do recommend the shingles vaccine when your husband is more stable. Don't forget that you are due for a colonoscopy.

## 2015-01-29 NOTE — Progress Notes (Signed)
   Subjective:    Patient ID: Jacqueline GrosLynn M Hayes, female    DOB: [birth year], 59 y.o.   MRN: [id number]  HPI Discussed shingles vaccine: Husband on embrel and having a bad time.  Will put off for now.   She will call for colonoscopy.   Has problems with intertrigo with excess skin from wt loss surg Recent bleeding from umbilicus near a port incision old.  No fever or redness.    Review of Systems     Objective:   Physical Exam abd benign.  Umbilicus healed and no redness.        Assessment & Plan:

## 2015-01-29 NOTE — Assessment & Plan Note (Signed)
Unclear to me whether recent umbilical bleeding was intertrigo or perhaps her spitting out an old subcu stitch.  Regardless. Healing now.

## 2015-02-08 ENCOUNTER — Encounter: Payer: Self-pay | Admitting: Family Medicine

## 2015-02-08 ENCOUNTER — Ambulatory Visit
Admission: RE | Admit: 2015-02-08 | Discharge: 2015-02-08 | Disposition: A | Discharge status: Home / Self Care | Payer: BC Managed Care – PPO | Source: Ambulatory Visit

## 2015-02-08 DIAGNOSIS — Z1231 Encounter for screening mammogram for malignant neoplasm of breast: Secondary | ICD-10-CM

## 2015-02-08 NOTE — Telephone Encounter (Signed)
-----   Message from Jacqueline Hayes to Jacqueline MannersWilliam A Hensel, Jacqueline Hayes sent at 02/08/2015 7:48 AM -----      Hi Dr. Leveda AnnaHensel,    After seeing you a few weeks ago, I have been cleaning the inside well with tissues after shower and using Miconazole antifungal 2% cream in and around my navel. This morning I noticed that it is red again and when I use a tissue to clean the inside, a pink tinge is on the tissue. It is itching also. Should I start using the clobetasol propionate .05% again? It cleared the problem up the last time. I am beginning to think this is not a yeast infection but something else. Please advise.    Thanks,    [name]

## 2015-02-09 ENCOUNTER — Encounter: Payer: Self-pay | Admitting: Internal Medicine

## 2015-02-23 ENCOUNTER — Other Ambulatory Visit: Payer: Self-pay | Admitting: *Deleted

## 2015-02-23 DIAGNOSIS — I10 Essential (primary) hypertension: Secondary | ICD-10-CM

## 2015-02-23 MED ORDER — LEVOTHYROXINE SODIUM 125 MCG PO TABS: 125.0000 ug | ORAL_TABLET | Freq: Every day | ORAL | Status: DC

## 2015-02-23 MED ORDER — VALSARTAN 80 MG PO TABS: 80.0000 mg | ORAL_TABLET | Freq: Every day | ORAL | Status: DC

## 2015-02-23 MED ORDER — POTASSIUM CHLORIDE CRYS ER 10 MEQ PO TBCR: EXTENDED_RELEASE_TABLET | ORAL | Status: DC

## 2015-03-16 ENCOUNTER — Ambulatory Visit (AMBULATORY_SURGERY_CENTER): Payer: Self-pay

## 2015-03-16 VITALS — Ht 64.0 in | Wt 198.6 lb

## 2015-03-16 DIAGNOSIS — Z1211 Encounter for screening for malignant neoplasm of colon: Secondary | ICD-10-CM

## 2015-03-16 MED ORDER — MOVIPREP 100 G PO SOLR: ORAL | Status: DC

## 2015-03-16 NOTE — Progress Notes (Signed)
Per pt, no allergies to soy or egg products.Pt not taking any weight loss meds or using  O2 at home. 

## 2015-03-24 ENCOUNTER — Encounter: Payer: Self-pay | Admitting: Internal Medicine

## 2015-03-31 ENCOUNTER — Ambulatory Visit (AMBULATORY_SURGERY_CENTER): Payer: BC Managed Care – PPO | Admitting: Internal Medicine

## 2015-03-31 ENCOUNTER — Encounter: Payer: Self-pay | Admitting: Internal Medicine

## 2015-03-31 VITALS — BP 130/83 | HR 51 | Temp 97.2°F | Resp 20 | Ht 64.0 in | Wt 198.0 lb

## 2015-03-31 DIAGNOSIS — Z1211 Encounter for screening for malignant neoplasm of colon: Secondary | ICD-10-CM | POA: Diagnosis present

## 2015-03-31 MED ORDER — SODIUM CHLORIDE 0.9 % IV SOLN: 500.0000 mL | INTRAVENOUS | Status: DC

## 2015-03-31 MED ORDER — SODIUM CHLORIDE 0.9 % IV SOLN
500.0000 mL | INTRAVENOUS | Status: DC
Start: 2015-03-31 — End: 2015-03-31

## 2015-03-31 NOTE — Op Note (Signed)
[facility] [address] [place], [id number]   COLONOSCOPY PROCEDURE REPORT  PATIENT: Jacqueline Hayes, Jacqueline Hayes  MR#: [id number] BIRTHDATE: 29-Sep-1956 , 58  yrs. old GENDER: female ENDOSCOPIST: Hart Carwinora Hayes Brodie, MD REFERRED XB:[id number]:William Hensel, Hayes.D. PROCEDURE DATE:  03/31/2015 PROCEDURE:   Colonoscopy, screening First Screening Colonoscopy - Avg.  risk and is 50 yrs.  old or older - No.  Prior Negative Screening - Now for repeat screening. 10 or more years since last screening  History of Adenoma - Now for follow-up colonoscopy & has been > or = to 3 yrs.  N/A ASA CLASS:   Class II INDICATIONS:Screening for colonic neoplasia, Colorectal Neoplasm Risk Assessment for this procedure is average risk, and prior colonoscopy in December 2005 was a normal exam. MEDICATIONS: Monitored anesthesia care and Propofol 240 mg IV  DESCRIPTION OF PROCEDURE:   After the risks benefits and alternatives of the procedure were thoroughly explained, informed consent was obtained.  The digital rectal exam revealed no abnormalities of the rectum.   The LB PFC-H190 [id number]  endoscope was introduced through the anus and advanced to the cecum, which was identified by both the appendix and ileocecal valve. No adverse events experienced.   The quality of the prep was excellent. (MoviPrep was used)  The instrument was then slowly withdrawn as the colon was fully examined.      COLON FINDINGS: A normal appearing cecum, ileocecal valve, and appendiceal orifice were identified.  The ascending, transverse, descending, sigmoid colon, and rectum appeared unremarkable. Retroflexed views revealed no abnormalities. The time to cecum = 7.53 Withdrawal time = 6.45   The scope was withdrawn and the procedure completed. COMPLICATIONS: There were no immediate complications.  ENDOSCOPIC IMPRESSION: Normal colonoscopy  RECOMMENDATIONS: High fiber diet Recall colonoscopy in 10 years  eSigned:  Hart Carwinora Hayes Brodie,  MD 03/31/2015 12:23 PM   cc:

## 2015-03-31 NOTE — Patient Instructions (Signed)
YOU HAD AN ENDOSCOPIC PROCEDURE TODAY AT THE [facility]:   Refer to the procedure report that was given to you for any specific questions about what was found during the examination.  If the procedure report does not answer your questions, please call your gastroenterologist to clarify.  If you requested that your care partner not be given the details of your procedure findings, then the procedure report has been included in a sealed envelope for you to review at your convenience later.  YOU SHOULD EXPECT: Some feelings of bloating in the abdomen. Passage of more gas than usual.  Walking can help get rid of the air that was put into your GI tract during the procedure and reduce the bloating. If you had a lower endoscopy (such as a colonoscopy or flexible sigmoidoscopy) you may notice spotting of blood in your stool or on the toilet paper. If you underwent a bowel prep for your procedure, you may not have a normal bowel movement for a few days.  Please Note:  You might notice some irritation and congestion in your nose or some drainage.  This is from the oxygen used during your procedure.  There is no need for concern and it should clear up in a day or so.  SYMPTOMS TO REPORT IMMEDIATELY:   Following lower endoscopy (colonoscopy or flexible sigmoidoscopy):  Excessive amounts of blood in the stool  Significant tenderness or worsening of abdominal pains  Swelling of the abdomen that is new, acute  Fever of 100F or higher   For urgent or emergent issues, a gastroenterologist can be reached at any hour by calling [phone].   DIET: Your first meal following the procedure should be a small meal and then it is ok to progress to your normal diet. Heavy or fried foods are harder to digest and may make you feel nauseous or bloated.  Likewise, meals heavy in dairy and vegetables can increase bloating.  Drink plenty of fluids but you should avoid alcoholic beverages for 24  hours.  ACTIVITY:  You should plan to take it easy for the rest of today and you should NOT DRIVE or use heavy machinery until tomorrow (because of the sedation medicines used during the test).    FOLLOW UP: Our staff will call the number listed on your records the next business day following your procedure to check on you and address any questions or concerns that you may have regarding the information given to you following your procedure. If we do not reach you, we will leave a message.  However, if you are feeling well and you are not experiencing any problems, there is no need to return our call.  We will assume that you have returned to your regular daily activities without incident.  If any biopsies were taken you will be contacted by phone or by letter within the next 1-3 weeks.  Please call us at [phone] if you have not heard about the biopsies in 3 weeks.    SIGNATURES/CONFIDENTIALITY: You and/or your care partner have signed paperwork which will be entered into your electronic medical record.  These signatures attest to the fact that that the information above on your After Visit Summary has been reviewed and is understood.  Full responsibility of the confidentiality of this discharge information lies with you and/or your care-partner.  High fiber diet information.    Next colonoscopy 10  Years.

## 2015-03-31 NOTE — Progress Notes (Signed)
A/ox3, pleased with MAC, report to RN 

## 2015-04-01 ENCOUNTER — Telehealth: Payer: Self-pay | Admitting: *Deleted

## 2015-04-01 NOTE — Telephone Encounter (Signed)
  Follow up Call-  Call back number 03/31/2015  Post procedure Call Back phone  # [phone]  Permission to leave phone message Yes     Patient questions:  Do you have a fever, pain , or abdominal swelling? No. Pain Score  0 *  Have you tolerated food without any problems? Yes.    Have you been able to return to your normal activities? Yes.    Do you have any questions about your discharge instructions: Diet   No. Medications  No. Follow up visit  No.  Do you have questions or concerns about your Care? No.  Actions: * If pain score is 4 or above: No action needed, pain <4.

## 2015-09-29 ENCOUNTER — Encounter: Payer: Self-pay | Admitting: Family Medicine

## 2015-10-21 ENCOUNTER — Ambulatory Visit (INDEPENDENT_AMBULATORY_CARE_PROVIDER_SITE_OTHER): Payer: BC Managed Care – PPO | Admitting: Family Medicine

## 2015-10-21 ENCOUNTER — Encounter: Payer: Self-pay | Admitting: Family Medicine

## 2015-10-21 VITALS — BP 128/71 | HR 71 | Temp 97.9°F | Ht 64.0 in | Wt 197.9 lb

## 2015-10-21 DIAGNOSIS — Z114 Encounter for screening for human immunodeficiency virus [HIV]: Secondary | ICD-10-CM

## 2015-10-21 DIAGNOSIS — Z0000 Encounter for general adult medical examination without abnormal findings: Secondary | ICD-10-CM

## 2015-10-21 DIAGNOSIS — E039 Hypothyroidism, unspecified: Secondary | ICD-10-CM

## 2015-10-21 DIAGNOSIS — Z1159 Encounter for screening for other viral diseases: Secondary | ICD-10-CM | POA: Diagnosis not present

## 2015-10-21 DIAGNOSIS — I10 Essential (primary) hypertension: Secondary | ICD-10-CM

## 2015-10-21 DIAGNOSIS — E876 Hypokalemia: Secondary | ICD-10-CM | POA: Insufficient documentation

## 2015-10-21 DIAGNOSIS — Z9884 Bariatric surgery status: Secondary | ICD-10-CM

## 2015-10-21 LAB — FERRITIN: Ferritin: 258 ng/mL (ref 10–291)

## 2015-10-21 LAB — TSH: TSH: 4.191 u[IU]/mL (ref 0.350–4.500)

## 2015-10-21 LAB — CBC
HCT: 38.2 % (ref 36.0–46.0)
Hemoglobin: 12.5 g/dL (ref 12.0–15.0)
MCH: 29.5 pg (ref 26.0–34.0)
MCHC: 32.7 g/dL (ref 30.0–36.0)
MCV: 90.1 fL (ref 78.0–100.0)
MPV: 10.7 fL (ref 8.6–12.4)
PLATELETS: 190 10*3/uL (ref 150–400)
RBC: 4.24 MIL/uL (ref 3.87–5.11)
RDW: 13.2 % (ref 11.5–15.5)
WBC: 4.7 10*3/uL (ref 4.0–10.5)

## 2015-10-21 LAB — COMPREHENSIVE METABOLIC PANEL
ALT: 11 U/L (ref 6–29)
AST: 20 U/L (ref 10–35)
Albumin: 3.7 g/dL (ref 3.6–5.1)
Alkaline Phosphatase: 104 U/L (ref 33–130)
BUN: 16 mg/dL (ref 7–25)
CO2: 24 mmol/L (ref 20–31)
CREATININE: 0.47 mg/dL — AB (ref 0.50–0.99)
Calcium: 8.6 mg/dL (ref 8.6–10.4)
Chloride: 101 mmol/L (ref 98–110)
Glucose, Bld: 89 mg/dL (ref 65–99)
Potassium: 3.6 mmol/L (ref 3.5–5.3)
SODIUM: 138 mmol/L (ref 135–146)
TOTAL PROTEIN: 6.3 g/dL (ref 6.1–8.1)
Total Bilirubin: 0.5 mg/dL (ref 0.2–1.2)

## 2015-10-21 LAB — VITAMIN B12

## 2015-10-21 MED ORDER — POTASSIUM CHLORIDE CRYS ER 10 MEQ PO TBCR: EXTENDED_RELEASE_TABLET | ORAL | Status: DC

## 2015-10-21 MED ORDER — VALSARTAN 80 MG PO TABS: 80.0000 mg | ORAL_TABLET | Freq: Every day | ORAL | Status: DC

## 2015-10-21 MED ORDER — LEVOTHYROXINE SODIUM 125 MCG PO TABS: 125.0000 ug | ORAL_TABLET | Freq: Every day | ORAL | Status: DC

## 2015-10-21 MED ORDER — ZOSTER VACCINE LIVE 19400 UNT/0.65ML ~~LOC~~ SOLR: 0.6500 mL | Freq: Once | SUBCUTANEOUS | Status: DC

## 2015-10-21 NOTE — Patient Instructions (Signed)
Everything looks good.  I will call with lab results.  Also check My Chart Please let me know when you get your shingles vaccine. Also have your gynecologist send me copy of the pap smear.

## 2015-10-21 NOTE — Progress Notes (Signed)
   Subjective:    Patient ID: Jacqueline Hayes, female    DOB: Dec 27, 1955, 59 y.o.   MRN: [id number]  HPI  Jacqueline Hayes is doing quite well.  In for her annual exam.  She gets her Paps from gyn.  She is reluctant to get a flu shot and has been for years.  She will consider a zostavax. Issues: 1. S/P gastric bypass.  Need annual bipass labs.  Asymptomatic.  Wt has plateaued.  Watch B12 level.   2. Hypertension.  Only on valsartan 3. Needs cholesterol screening. 4. She has had persistant hypokalemia despite being on an ARB and not on any potassium wasting meds. 5. Vitamin D deficient in the past.  On treatment. 6. She has not done HIV or hep C screening. 7. Asymptomatic from thyroid standpoint.    Review of Systems  Denies CP, SOB, change in weight, appetite, bowel or bladder function.  Has had a dry cough for 3 days.  She thinks she picked up a cold form her grandchild.   For insurance reasons is switching pharmacies and request written rather than electronic precriptions.       Objective:   Physical ExamHEENT normal Neck supple without masses Lungs clear Cardiac RRR without m or G Abd benign Ext trace bilateral edema        Assessment & Plan:

## 2015-10-21 NOTE — Assessment & Plan Note (Signed)
One time screen 

## 2015-10-21 NOTE — Assessment & Plan Note (Signed)
Well controled. 

## 2015-10-21 NOTE — Assessment & Plan Note (Signed)
Add to annual labs.

## 2015-10-21 NOTE — Assessment & Plan Note (Signed)
Doing well with no at risk behaviors.  I am pleased that she is now walking 3 miles per day.

## 2015-10-21 NOTE — Assessment & Plan Note (Signed)
Check TSH 

## 2015-10-21 NOTE — Assessment & Plan Note (Signed)
Refill and recheck level

## 2015-10-22 LAB — HIV ANTIBODY (ROUTINE TESTING W REFLEX): HIV: NONREACTIVE

## 2015-10-22 LAB — HEPATITIS C ANTIBODY: HCV AB: NEGATIVE

## 2015-10-22 LAB — VITAMIN D 25 HYDROXY (VIT D DEFICIENCY, FRACTURES): VIT D 25 HYDROXY: 47 ng/mL (ref 30–100)

## 2015-11-04 ENCOUNTER — Encounter: Payer: Self-pay | Admitting: Family Medicine

## 2015-11-23 ENCOUNTER — Encounter: Payer: Self-pay | Admitting: Family Medicine

## 2015-11-23 DIAGNOSIS — E7800 Pure hypercholesterolemia, unspecified: Secondary | ICD-10-CM

## 2015-12-13 ENCOUNTER — Other Ambulatory Visit: Payer: Self-pay | Admitting: *Deleted

## 2015-12-13 DIAGNOSIS — E876 Hypokalemia: Secondary | ICD-10-CM

## 2015-12-13 MED ORDER — POTASSIUM CHLORIDE CRYS ER 10 MEQ PO TBCR: EXTENDED_RELEASE_TABLET | ORAL | Status: DC

## 2015-12-14 ENCOUNTER — Encounter: Payer: Self-pay | Admitting: Family Medicine

## 2016-01-04 ENCOUNTER — Other Ambulatory Visit: Payer: Self-pay

## 2016-01-04 DIAGNOSIS — Z1231 Encounter for screening mammogram for malignant neoplasm of breast: Secondary | ICD-10-CM

## 2016-02-09 ENCOUNTER — Ambulatory Visit
Admission: RE | Admit: 2016-02-09 | Discharge: 2016-02-09 | Disposition: A | Discharge status: Home / Self Care | Payer: BC Managed Care – PPO | Source: Ambulatory Visit

## 2016-02-09 DIAGNOSIS — Z1231 Encounter for screening mammogram for malignant neoplasm of breast: Secondary | ICD-10-CM

## 2016-03-28 ENCOUNTER — Telehealth: Payer: Self-pay | Admitting: Family Medicine

## 2016-03-28 NOTE — Telephone Encounter (Signed)
Bilateral worsening leg pain.  Has an appointment.  Wondered if OK to take ibuprofen 400mg  bid until seen.  I said yes, safe with foods.

## 2016-03-28 NOTE — Telephone Encounter (Signed)
Please contact Mrs. Cullop regarding pain to her legs and medication she's taking for it.

## 2016-04-06 ENCOUNTER — Ambulatory Visit (INDEPENDENT_AMBULATORY_CARE_PROVIDER_SITE_OTHER): Payer: BC Managed Care – PPO | Admitting: Family Medicine

## 2016-04-06 ENCOUNTER — Encounter: Payer: Self-pay | Admitting: Family Medicine

## 2016-04-06 ENCOUNTER — Ambulatory Visit
Admission: RE | Admit: 2016-04-06 | Discharge: 2016-04-06 | Disposition: A | Discharge status: Home / Self Care | Payer: BC Managed Care – PPO | Source: Ambulatory Visit | Attending: Family Medicine | Admitting: Family Medicine

## 2016-04-06 VITALS — BP 144/71 | HR 63 | Temp 98.0°F | Ht 64.0 in | Wt 192.8 lb

## 2016-04-06 DIAGNOSIS — M25562 Pain in left knee: Secondary | ICD-10-CM

## 2016-04-06 DIAGNOSIS — M25561 Pain in right knee: Secondary | ICD-10-CM

## 2016-04-06 DIAGNOSIS — E876 Hypokalemia: Secondary | ICD-10-CM

## 2016-04-06 DIAGNOSIS — I872 Venous insufficiency (chronic) (peripheral): Secondary | ICD-10-CM

## 2016-04-06 DIAGNOSIS — E039 Hypothyroidism, unspecified: Secondary | ICD-10-CM

## 2016-04-06 DIAGNOSIS — M8580 Other specified disorders of bone density and structure, unspecified site: Secondary | ICD-10-CM | POA: Diagnosis not present

## 2016-04-06 DIAGNOSIS — M810 Age-related osteoporosis without current pathological fracture: Secondary | ICD-10-CM | POA: Insufficient documentation

## 2016-04-06 DIAGNOSIS — M25569 Pain in unspecified knee: Secondary | ICD-10-CM | POA: Insufficient documentation

## 2016-04-06 LAB — BASIC METABOLIC PANEL
BUN: 18 mg/dL (ref 7–25)
CALCIUM: 8.8 mg/dL (ref 8.6–10.4)
CO2: 25 mmol/L (ref 20–31)
CREATININE: 0.57 mg/dL (ref 0.50–0.99)
Chloride: 102 mmol/L (ref 98–110)
Glucose, Bld: 82 mg/dL (ref 65–99)
Potassium: 3.6 mmol/L (ref 3.5–5.3)
Sodium: 139 mmol/L (ref 135–146)

## 2016-04-06 LAB — CK: Total CK: 45 U/L (ref 7–177)

## 2016-04-06 LAB — MAGNESIUM: MAGNESIUM: 1.8 mg/dL (ref 1.5–2.5)

## 2016-04-06 LAB — LIPID PANEL
CHOLESTEROL: 171 mg/dL (ref 125–200)
HDL: 58 mg/dL (ref >=46)
LDL Cholesterol: 90 mg/dL (ref <130)
Total CHOL/HDL Ratio: 2.9 Ratio (ref <=5.0)
Triglycerides: 113 mg/dL (ref <150)
VLDL: 23 mg/dL (ref <30)

## 2016-04-06 NOTE — Assessment & Plan Note (Signed)
Unclear to me whether primary arthritis or myositis.  Her conservative treatments have helped nicely.  Will check labs and X ray of her right knee.  Rt knee should tricompartment arthritis and possible osteopenia.

## 2016-04-06 NOTE — Progress Notes (Signed)
   Subjective:    Patient ID: Jacqueline Hayes, female    DOB: 12/10/1955, 60 y.o.   MRN: [id number]  HPI Patient has bilateral leg pain and swelling.  I believe the issues are not tightly linked. Bilateral swelling is present for years.  At one point she was on HCTZ for HBP which helped, but that was stopped after bariatric surg.  She is careful about a low salt diet.  Worse as the day goes on.  Leg selling is mildly worse over the past 2-3 weeks. Recent borderline high TSH noted Bilateral leg pain affecting both feet, both knees (right>left) and both calves.  Started as she began treadmill exercising again.  It has markedly improved as she cut back on her treadmill work, taken ibuprofen 400 mg qhs and improved her diet.  Has not yet gotten zostavax.  Husband on Embril.  Concerned about exposing him to live virus vaccine.  Per patient after discussing with his rheumatologist, he can go off the Embril for up to 2 weeks.     Review of Systems     Objective:   Physical Exam Bilateral 2+ symmetric edema of both ankles. She is largely pain free today.  Points to calves, knees and dorsum of both feet for pain areas when she had them.  Good bilateral pedal pulses.       Assessment & Plan:

## 2016-04-06 NOTE — Assessment & Plan Note (Signed)
Likely cause of swelling. 

## 2016-04-06 NOTE — Patient Instructions (Signed)
I will call with the lab and x ray results.   Keep up those life style changes that you know are healthy for you.

## 2016-04-06 NOTE — Assessment & Plan Note (Signed)
Suspected on plain film.  Will order bone density to confirm.

## 2016-04-06 NOTE — Assessment & Plan Note (Signed)
Will recheck TSH to make sure not contributing to her edema.

## 2016-04-10 ENCOUNTER — Telehealth: Payer: Self-pay | Admitting: Family Medicine

## 2016-04-10 NOTE — Telephone Encounter (Signed)
Order is already entered

## 2016-04-10 NOTE — Telephone Encounter (Signed)
Pt called because she is suppose to be getting a bone density test. She was checking that status and also wants [facility] make [facility]. jw

## 2016-04-11 NOTE — Telephone Encounter (Signed)
Bone density scheduled for 04/25/2016. Sunday SpillersSharon T Saunders, CMA

## 2016-04-25 ENCOUNTER — Telehealth: Payer: Self-pay | Admitting: Family Medicine

## 2016-04-25 ENCOUNTER — Ambulatory Visit
Admission: RE | Admit: 2016-04-25 | Discharge: 2016-04-25 | Disposition: A | Discharge status: Home / Self Care | Payer: BC Managed Care – PPO | Source: Ambulatory Visit | Attending: Family Medicine | Admitting: Family Medicine

## 2016-04-25 DIAGNOSIS — M8580 Other specified disorders of bone density and structure, unspecified site: Secondary | ICD-10-CM

## 2016-04-25 NOTE — Telephone Encounter (Signed)
Called and informed of bone density results.

## 2016-10-05 ENCOUNTER — Encounter (HOSPITAL_COMMUNITY): Payer: Self-pay

## 2016-10-25 ENCOUNTER — Other Ambulatory Visit: Payer: Self-pay | Admitting: Family Medicine

## 2016-10-25 DIAGNOSIS — E039 Hypothyroidism, unspecified: Secondary | ICD-10-CM

## 2016-10-25 DIAGNOSIS — I10 Essential (primary) hypertension: Secondary | ICD-10-CM

## 2016-10-25 DIAGNOSIS — E876 Hypokalemia: Secondary | ICD-10-CM

## 2016-12-26 ENCOUNTER — Other Ambulatory Visit: Payer: Self-pay | Admitting: Obstetrics and Gynecology

## 2016-12-26 DIAGNOSIS — Z1231 Encounter for screening mammogram for malignant neoplasm of breast: Secondary | ICD-10-CM

## 2017-01-03 ENCOUNTER — Other Ambulatory Visit: Payer: Self-pay | Admitting: Family Medicine

## 2017-01-03 MED ORDER — OSELTAMIVIR PHOSPHATE 75 MG PO CAPS: 75.0000 mg | ORAL_CAPSULE | Freq: Every day | ORAL | 0 refills | Status: DC

## 2017-01-03 NOTE — Telephone Encounter (Signed)
Dear Cliffton AstersWhite Team Please lether know [facility]! Denny LevySara Kashmere Daywalt

## 2017-01-03 NOTE — Telephone Encounter (Signed)
PT informed of Rx being sent. Jacqueline Hayes, April D, [place]

## 2017-01-03 NOTE — Telephone Encounter (Signed)
Pt has been exposed to the flu. Pt's husband has it and grandson. Pt would like to [facility] [place]. ep

## 2017-02-09 ENCOUNTER — Ambulatory Visit
Admission: RE | Admit: 2017-02-09 | Discharge: 2017-02-09 | Disposition: A | Discharge status: Home / Self Care | Payer: BC Managed Care – PPO | Source: Ambulatory Visit | Attending: Obstetrics and Gynecology | Admitting: Obstetrics and Gynecology

## 2017-02-09 DIAGNOSIS — Z1231 Encounter for screening mammogram for malignant neoplasm of breast: Secondary | ICD-10-CM

## 2017-07-05 ENCOUNTER — Encounter: Payer: Self-pay | Admitting: Family Medicine

## 2017-07-05 ENCOUNTER — Ambulatory Visit (INDEPENDENT_AMBULATORY_CARE_PROVIDER_SITE_OTHER): Payer: BC Managed Care – PPO | Admitting: Family Medicine

## 2017-07-05 DIAGNOSIS — I10 Essential (primary) hypertension: Secondary | ICD-10-CM

## 2017-07-05 DIAGNOSIS — Z9884 Bariatric surgery status: Secondary | ICD-10-CM

## 2017-07-05 DIAGNOSIS — E039 Hypothyroidism, unspecified: Secondary | ICD-10-CM

## 2017-07-05 NOTE — Patient Instructions (Signed)
I am ordering a lot of tests because the symptoms you are having could be many different things.   The blood work should be available tomorrow We should get the echocardiogram in about a week. See me in two weeks to go over results and likely change up your medications.

## 2017-07-06 ENCOUNTER — Telehealth: Payer: Self-pay | Admitting: Family Medicine

## 2017-07-06 LAB — CMP14+EGFR
A/G RATIO: 2.3 — AB (ref 1.2–2.2)
ALK PHOS: 129 IU/L — AB (ref 39–117)
ALT: 17 IU/L (ref 0–32)
AST: 19 IU/L (ref 0–40)
Albumin: 4.3 g/dL (ref 3.6–4.8)
BILIRUBIN TOTAL: 0.3 mg/dL (ref 0.0–1.2)
BUN/Creatinine Ratio: 32 — ABNORMAL HIGH (ref 12–28)
BUN: 18 mg/dL (ref 8–27)
CHLORIDE: 104 mmol/L (ref 96–106)
CO2: 25 mmol/L (ref 20–29)
Calcium: 9.2 mg/dL (ref 8.7–10.3)
Creatinine, Ser: 0.56 mg/dL — ABNORMAL LOW (ref 0.57–1.00)
GFR calc non Af Amer: 100 mL/min/{1.73_m2} (ref >59)
GFR, EST AFRICAN AMERICAN: 116 mL/min/{1.73_m2} (ref >59)
GLUCOSE: 90 mg/dL (ref 65–99)
Globulin, Total: 1.9 g/dL (ref 1.5–4.5)
POTASSIUM: 4.1 mmol/L (ref 3.5–5.2)
Sodium: 143 mmol/L (ref 134–144)
TOTAL PROTEIN: 6.2 g/dL (ref 6.0–8.5)

## 2017-07-06 LAB — LIPID PANEL
Chol/HDL Ratio: 3.1 ratio (ref 0.0–4.4)
Cholesterol, Total: 214 mg/dL — ABNORMAL HIGH (ref 100–199)
HDL: 68 mg/dL (ref >39)
LDL CALC: 116 mg/dL — AB (ref 0–99)
Triglycerides: 151 mg/dL — ABNORMAL HIGH (ref 0–149)
VLDL CHOLESTEROL CAL: 30 mg/dL (ref 5–40)

## 2017-07-06 LAB — CBC
HEMATOCRIT: 40.9 % (ref 34.0–46.6)
Hemoglobin: 13.2 g/dL (ref 11.1–15.9)
MCH: 29.8 pg (ref 26.6–33.0)
MCHC: 32.3 g/dL (ref 31.5–35.7)
MCV: 92 fL (ref 79–97)
PLATELETS: 167 10*3/uL (ref 150–379)
RBC: 4.43 x10E6/uL (ref 3.77–5.28)
RDW: 13.4 % (ref 12.3–15.4)
WBC: 3.3 10*3/uL — ABNORMAL LOW (ref 3.4–10.8)

## 2017-07-06 LAB — VITAMIN B12: Vitamin B-12: >2000 pg/mL — ABNORMAL HIGH (ref 232–1245)

## 2017-07-06 LAB — FERRITIN: Ferritin: 180 ng/mL — ABNORMAL HIGH (ref 15–150)

## 2017-07-06 LAB — TSH: TSH: 1.27 u[IU]/mL (ref 0.450–4.500)

## 2017-07-06 NOTE — Assessment & Plan Note (Signed)
Check TSH.  Hypothyroid would account for some sx.  (TSH normal)

## 2017-07-06 NOTE — Assessment & Plan Note (Signed)
Puts her at risk for iron deficiency or B12 deficiency anemia.  Check labs.

## 2017-07-06 NOTE — Assessment & Plan Note (Signed)
Seems to have good control.  My biggest concern is CHF causing her sx complex.  Will check echo.

## 2017-07-06 NOTE — Progress Notes (Signed)
   Subjective:    Patient ID: Jacqueline Hayes, female    DOB: 06-17-1956, 61 y.o.   MRN: [id number]  HPI  She is here for a "physical" but clearly this turned into an E&M visit. Problem is very significant weakness, tiredness on exertion for 4-5 months.  Over the same timeframe, she has noted significant ankle swelling as the day progresses.  Final symptom in this complex is excessive daytime sleepiness.  She is S/P bariatric surgery and at risk for anemia via a couple of nutritional deficiencies.  No recent blood work.   She has regained some of the weight lost through bariatric surg.  Denies excessive snoring.  Has longstanding hypertension, which seems to be well controlled.  Denies orthopnea and PND.  Known hypothyroid, no recent TSH.       Review of Systems     Objective:   Physical Exam VS noted Lungs clear Cardiac RRR without m or g Abd benign Ext 3+ bilateral symmetric edema.       Assessment & Plan:

## 2017-07-06 NOTE — Telephone Encounter (Signed)
[facility] with some issues with her medication (I put a copy in your box).  She is now taking Valsartan 80 mg.  Please call her to discuss concerns, thank you.

## 2017-07-09 NOTE — Telephone Encounter (Signed)
Recall on valsartin from her manufacturer ([employer]).  Now has a valsartin Rx from a different manufacturer ([employer])  We will discuss further at visit on 8/30.  No other switch for now.

## 2017-07-10 ENCOUNTER — Telehealth: Payer: Self-pay

## 2017-07-10 ENCOUNTER — Ambulatory Visit (HOSPITAL_COMMUNITY): Payer: BC Managed Care – PPO | Attending: Cardiovascular Disease

## 2017-07-10 ENCOUNTER — Other Ambulatory Visit: Payer: Self-pay

## 2017-07-10 DIAGNOSIS — I420 Dilated cardiomyopathy: Secondary | ICD-10-CM | POA: Diagnosis not present

## 2017-07-10 DIAGNOSIS — I10 Essential (primary) hypertension: Secondary | ICD-10-CM | POA: Diagnosis present

## 2017-07-10 DIAGNOSIS — I051 Rheumatic mitral insufficiency: Secondary | ICD-10-CM | POA: Insufficient documentation

## 2017-07-10 NOTE — Telephone Encounter (Signed)
-----   Message from Luther Parody sent at 07/10/2017  7:40 AM EDT ----- Regarding: RE: Echo Call the office and let them know that echo has been approved. Berkley Harvey # [id number] ----- Message ----- From: Sunday Spillers, CMA Sent: 07/09/2017   1:37 PM To: Tia C Hill Subject: Echo                                           Sorry Tia! This patient has an Echo scheduled for tomorrow but will need prior authorization. If I need to reschedule the appt please let me know. Sunday Spillers, CMA

## 2017-07-10 NOTE — Telephone Encounter (Signed)
Called [facility]. They have the authorization number for Echo. Sunday Spillers, CMA

## 2017-07-19 ENCOUNTER — Encounter: Payer: Self-pay | Admitting: Family Medicine

## 2017-07-19 ENCOUNTER — Ambulatory Visit (INDEPENDENT_AMBULATORY_CARE_PROVIDER_SITE_OTHER): Payer: BC Managed Care – PPO | Admitting: Family Medicine

## 2017-07-19 DIAGNOSIS — Z9884 Bariatric surgery status: Secondary | ICD-10-CM | POA: Diagnosis not present

## 2017-07-19 DIAGNOSIS — I10 Essential (primary) hypertension: Secondary | ICD-10-CM | POA: Diagnosis not present

## 2017-07-19 DIAGNOSIS — I5032 Chronic diastolic (congestive) heart failure: Secondary | ICD-10-CM | POA: Diagnosis not present

## 2017-07-19 MED ORDER — FUROSEMIDE 20 MG PO TABS: 20.0000 mg | ORAL_TABLET | Freq: Every day | ORAL | 3 refills | Status: DC

## 2017-07-19 MED ORDER — LOSARTAN POTASSIUM 100 MG PO TABS: 100.0000 mg | ORAL_TABLET | Freq: Every day | ORAL | 3 refills | Status: DC

## 2017-07-19 NOTE — Patient Instructions (Signed)
You have congestive heart failure, not due to a weak heart muscle, but because your heart muscle has some trouble relaxing. The valsartin - I switched to losartin, is a good medicine for the CHF. I added a small dose of furosemide - a fluid pill. I would like to see you in 4-6 weeks to check how you are doing on the new medicine.

## 2017-07-20 NOTE — Assessment & Plan Note (Signed)
No important nutritional deficiencies noted.

## 2017-07-20 NOTE — Assessment & Plan Note (Signed)
Switch to equivalent dose of losartin per patient request.

## 2017-07-20 NOTE — Assessment & Plan Note (Signed)
Add furosemide.  Watch for gout flair.

## 2017-07-20 NOTE — Progress Notes (Signed)
   Subjective:    Patient ID: Jacqueline Hayes, female    DOB: 1956-10-01, 61 y.o.   MRN: [id number]  HPI  Here to review lab work and echo.  Issues. 1. S/P bariatric surg.  No important nutritional deficiencies noted on labs.  She is decreasing her vitamin intake conspicuously due to concerns over safety, see #2. 2. She is quite concerned about recall of valsartin since she had been on for years.  Apparently contaminated [place].  She is receiving valsartin now from another manufacturer.  After discussion, perfers to switch to losartin.  BP has been stable.  Has had gout in the past.  No recent gout flairs. 3. CHF.  Has diastolic cHF, which I explained to her.  Less edema since paying more attention to low salt diet.  Still with ankle swelling.  No DOE.    Review of Systems     Objective:   Physical Exam VS noted Lungs clear Cardiac RRR without m or g Ext 2+ bilateral edema.        Assessment & Plan:

## 2017-08-16 ENCOUNTER — Ambulatory Visit: Payer: BC Managed Care – PPO | Admitting: Family Medicine

## 2017-08-29 ENCOUNTER — Encounter: Payer: Self-pay | Admitting: Family Medicine

## 2017-08-29 ENCOUNTER — Ambulatory Visit (INDEPENDENT_AMBULATORY_CARE_PROVIDER_SITE_OTHER): Payer: BC Managed Care – PPO | Admitting: Family Medicine

## 2017-08-29 DIAGNOSIS — I10 Essential (primary) hypertension: Secondary | ICD-10-CM

## 2017-08-29 DIAGNOSIS — I5032 Chronic diastolic (congestive) heart failure: Secondary | ICD-10-CM | POA: Diagnosis not present

## 2017-08-29 NOTE — Patient Instructions (Signed)
See me in 6 months.  I will call with the blood test results.

## 2017-08-29 NOTE — Assessment & Plan Note (Addendum)
Good control on current meds. Check potassium on lasix

## 2017-08-29 NOTE — Assessment & Plan Note (Signed)
Stable on current meds 

## 2017-08-29 NOTE — Progress Notes (Signed)
   Subjective:    Patient ID: Jacqueline Hayes, female    DOB: 1956-05-25, 61 y.o.   MRN: [id number]  HPI Feels much better on new medications.  More active and less short of breath.  No side effects that she has noticed.  She does tend to run low potassium (actually runs in her family). Last blood work was not on lasix.    Review of Systems     Objective:   Physical Exam Wt down 4 lbs.  BP fine. Lungs clear Cardiac RRR without m or g Ext trace edema.          Assessment & Plan:

## 2017-08-30 LAB — BASIC METABOLIC PANEL
BUN/Creatinine Ratio: 28 (ref 12–28)
BUN: 17 mg/dL (ref 8–27)
CHLORIDE: 99 mmol/L (ref 96–106)
CO2: 25 mmol/L (ref 20–29)
CREATININE: 0.6 mg/dL (ref 0.57–1.00)
Calcium: 9.4 mg/dL (ref 8.7–10.3)
GFR calc Af Amer: 113 mL/min/{1.73_m2} (ref >59)
GFR calc non Af Amer: 98 mL/min/{1.73_m2} (ref >59)
GLUCOSE: 89 mg/dL (ref 65–99)
Potassium: 4.2 mmol/L (ref 3.5–5.2)
Sodium: 141 mmol/L (ref 134–144)

## 2017-10-22 ENCOUNTER — Other Ambulatory Visit: Payer: Self-pay | Admitting: Family Medicine

## 2017-10-22 DIAGNOSIS — E876 Hypokalemia: Secondary | ICD-10-CM

## 2017-10-23 ENCOUNTER — Other Ambulatory Visit: Payer: Self-pay | Admitting: Family Medicine

## 2017-10-23 DIAGNOSIS — E039 Hypothyroidism, unspecified: Secondary | ICD-10-CM

## 2017-10-23 MED ORDER — LEVOTHYROXINE SODIUM 125 MCG PO TABS: 125.0000 ug | ORAL_TABLET | Freq: Every day | ORAL | 3 refills | Status: DC

## 2017-11-20 HISTORY — PX: OTHER SURGICAL HISTORY: SHX169

## 2017-11-22 ENCOUNTER — Other Ambulatory Visit: Payer: Self-pay

## 2017-11-22 ENCOUNTER — Encounter: Payer: Self-pay | Admitting: Family Medicine

## 2017-11-22 ENCOUNTER — Ambulatory Visit: Payer: BC Managed Care – PPO | Admitting: Family Medicine

## 2017-11-22 DIAGNOSIS — H00014 Hordeolum externum left upper eyelid: Secondary | ICD-10-CM | POA: Diagnosis not present

## 2017-11-22 DIAGNOSIS — H00019 Hordeolum externum unspecified eye, unspecified eyelid: Secondary | ICD-10-CM | POA: Insufficient documentation

## 2017-11-22 MED ORDER — ERYTHROMYCIN 5 MG/GM OP OINT: 1.0000 "application " | TOPICAL_OINTMENT | Freq: Every day | OPHTHALMIC | 0 refills | Status: DC

## 2017-11-22 NOTE — Patient Instructions (Signed)
Please call your eye doctor - that sty may need to be lanced. Use the antibiotic ointment and keep up with the warm compresses - it may help it drain on its own.

## 2017-11-23 ENCOUNTER — Encounter: Payer: Self-pay | Admitting: Family Medicine

## 2017-11-23 NOTE — Progress Notes (Signed)
   Subjective:    Patient ID: Jacqueline Hayes, female    DOB: July 10, 1956, 62 y.o.   MRN: [id number]  HPI Sty left upper eyelid x 2 weeks.  No visual changes.  No fever or other systemic symptoms.  A little uncomfortable when she blinks.  She has been using warm compresses.    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  Concerned re: "infection"     Review of Systems     Objective:   Physical Exam Left eye prominent hordeolum on upper lid.  Small surrounding erythema.  Not draining.  Conjunctiva clear bilaterally.  PERRLA, EOMI        Assessment & Plan:

## 2017-11-23 NOTE — Assessment & Plan Note (Signed)
Discussed with patient lack of evidence of antibiotics.  Still, with upcoming eye surg, will use qhs topical erythro.   Explained surgical drainage/excision is only effective rx. She will continue warm compresses and contact her ophthalmologist.

## 2017-12-04 ENCOUNTER — Encounter (HOSPITAL_COMMUNITY): Payer: Self-pay

## 2017-12-17 HISTORY — PX: OTHER SURGICAL HISTORY: SHX169

## 2017-12-25 ENCOUNTER — Telehealth: Payer: Self-pay | Admitting: Family Medicine

## 2017-12-25 MED ORDER — OSELTAMIVIR PHOSPHATE 75 MG PO CAPS: 75.0000 mg | ORAL_CAPSULE | Freq: Every day | ORAL | 0 refills | Status: DC

## 2017-12-25 NOTE — Telephone Encounter (Signed)
Pt was exposed to type b flu. She had eye surgery about a week ago. She would like dr Leveda Annahensel to [facility] [place]. Please advise

## 2017-12-25 NOTE — Telephone Encounter (Signed)
Not sick but adult son, with whom she has contact, Dxed with Type B influenza.  Will begin the daily prophylactic dose.

## 2017-12-27 ENCOUNTER — Other Ambulatory Visit: Payer: Self-pay | Admitting: Family Medicine

## 2017-12-27 DIAGNOSIS — Z1231 Encounter for screening mammogram for malignant neoplasm of breast: Secondary | ICD-10-CM

## 2018-02-20 ENCOUNTER — Ambulatory Visit
Admission: RE | Admit: 2018-02-20 | Discharge: 2018-02-20 | Disposition: A | Discharge status: Home / Self Care | Payer: BC Managed Care – PPO | Source: Ambulatory Visit | Attending: Family Medicine | Admitting: Family Medicine

## 2018-02-20 DIAGNOSIS — Z1231 Encounter for screening mammogram for malignant neoplasm of breast: Secondary | ICD-10-CM

## 2018-02-27 ENCOUNTER — Encounter: Payer: Self-pay | Admitting: Family Medicine

## 2018-02-27 ENCOUNTER — Other Ambulatory Visit: Payer: Self-pay

## 2018-02-27 ENCOUNTER — Ambulatory Visit (INDEPENDENT_AMBULATORY_CARE_PROVIDER_SITE_OTHER): Payer: BC Managed Care – PPO | Admitting: Family Medicine

## 2018-02-27 DIAGNOSIS — F419 Anxiety disorder, unspecified: Secondary | ICD-10-CM | POA: Diagnosis not present

## 2018-02-27 DIAGNOSIS — M8580 Other specified disorders of bone density and structure, unspecified site: Secondary | ICD-10-CM | POA: Diagnosis not present

## 2018-02-27 DIAGNOSIS — I10 Essential (primary) hypertension: Secondary | ICD-10-CM | POA: Diagnosis not present

## 2018-02-27 DIAGNOSIS — I5032 Chronic diastolic (congestive) heart failure: Secondary | ICD-10-CM

## 2018-02-27 DIAGNOSIS — R002 Palpitations: Secondary | ICD-10-CM | POA: Diagnosis not present

## 2018-02-27 MED ORDER — LORAZEPAM 1 MG PO TABS: 1.0000 mg | ORAL_TABLET | Freq: Every day | ORAL | 3 refills | Status: DC

## 2018-02-27 NOTE — Assessment & Plan Note (Addendum)
Concern for arrythmia such as PAF.  Will refer to cards for further diagnostics.  Likely needs an event monitor.

## 2018-02-27 NOTE — Patient Instructions (Signed)
Life is good. Get the bone density any time after 04/25/18.  Someone should call. Someone should call about the cardiology appointment.   See me next fall for flu shot and blood work

## 2018-02-28 ENCOUNTER — Encounter: Payer: Self-pay | Admitting: Family Medicine

## 2018-02-28 NOTE — Assessment & Plan Note (Signed)
Seems well controled.  I doubt it is causing her current symptoms.

## 2018-02-28 NOTE — Progress Notes (Signed)
Patient ID: Jacqueline Hayes, female   DOB: 1956-07-17, 62 y.o.   MRN: [id number] I was present and/or repeated all elements of the H&PE by MS 3 [place].  Agree with her documentation.   Not an annual physical.  More a problem focused issue. I will elaborate on the episodic weakness and shortness of breath.  I have previously worked this up and thought I had found the etiology of HFpEF.  See echo of 07/10/17.  Now I wonder.   She has most days in which she is asymptomatic.  Then, out of the blue, she will have a spell lasting 1-3 days of significant generalized weakness, DOE and lack of energy.  These spells do not correlate with leg edema.  They do correlate well with palpitations.  The spells and palpitations will resolve abruptly, together. Today is a normal day for her.

## 2018-02-28 NOTE — Assessment & Plan Note (Signed)
Ordered repeat bone scan to be completed after 04/25/2018. If progression of bone density loss is evident, will start a bisphosphonate.

## 2018-02-28 NOTE — Progress Notes (Signed)
Date of Visit: 02/27/2018   HPI:  Episodic fatigue with palpitations -Feels "drained of energy" for 2-3 days at a time -Episodes occur occasionally (less than once per month) with no identifiable cause or pattern -Episodes started over a year ago and have not changed in frequency since onset -Endorses weakness, sensation of her "heart not beating right" and SOB with minimal exertion during episodes -Denies low mood, anhedonia, chest pain or tightness during episodes -Patient feels well between episodes  Insomnia -Has had difficulty sleeping for years but seems to be worse over the past month -Occasionally (3-4x per month) takes one of husband's lorazepam pills to get to sleep   ROS: See HPI.  PMFSH: Diastolic heart failure, hypertension, hypothyroidism, osteopenia  PHYSICAL EXAM: BP 120/80   Pulse (!) 58   Temp 98.1 F (36.7 C) (Oral)   Ht 5\' 4"  (1.626 m)   Wt 194 lb 12.8 oz (88.4 kg)   SpO2 99%   BMI 33.44 kg/m  Gen: Well appearing, no acute distress Neuro: grossly nonfocal, normal speech  ASSESSMENT/PLAN:  Health maintenance:  Has not received shingles vaccine. Understands importance of vaccine but has just put off getting this done.  Palpitations Concern for arrythmia such as PAF.  Will refer to cards for further diagnostics.  Anxiety Recently having more difficulty sleeping at night. Instructed to use lorazepam sparingly due to dependence potential with consistent use.  Osteopenia Ordered repeat bone scan to be completed after 04/25/2018. If progression of bone density loss is evident, will start a bisphosphonate.

## 2018-02-28 NOTE — Assessment & Plan Note (Addendum)
Recently having more difficulty sleeping at night. Instructed to use lorazepam sparingly due to dependence potential with consistent use. Her hx suggests very low abuse potential.

## 2018-02-28 NOTE — Assessment & Plan Note (Signed)
Well controled.  Does not seem to be causing current symptoms.

## 2018-03-11 HISTORY — PX: OTHER SURGICAL HISTORY: SHX169

## 2018-04-26 ENCOUNTER — Other Ambulatory Visit: Payer: BC Managed Care – PPO

## 2018-04-30 ENCOUNTER — Encounter: Payer: Self-pay | Admitting: Family Medicine

## 2018-04-30 ENCOUNTER — Ambulatory Visit
Admission: RE | Admit: 2018-04-30 | Discharge: 2018-04-30 | Disposition: A | Discharge status: Home / Self Care | Payer: BC Managed Care – PPO | Source: Ambulatory Visit | Attending: Family Medicine | Admitting: Family Medicine

## 2018-04-30 ENCOUNTER — Telehealth: Payer: Self-pay | Admitting: Family Medicine

## 2018-04-30 DIAGNOSIS — M8580 Other specified disorders of bone density and structure, unspecified site: Secondary | ICD-10-CM

## 2018-04-30 DIAGNOSIS — M810 Age-related osteoporosis without current pathological fracture: Secondary | ICD-10-CM

## 2018-04-30 MED ORDER — ALENDRONATE SODIUM 70 MG PO TABS: 70.0000 mg | ORAL_TABLET | ORAL | 11 refills | Status: DC

## 2018-04-30 NOTE — Telephone Encounter (Signed)
Called and discussed

## 2018-04-30 NOTE — Telephone Encounter (Signed)
Pt called and said she received a Vm from Dr Leveda AnnaHensel and was returning his call. She said she is at home now and will be able to answer her phone.

## 2018-05-13 ENCOUNTER — Ambulatory Visit (INDEPENDENT_AMBULATORY_CARE_PROVIDER_SITE_OTHER): Payer: BC Managed Care – PPO | Admitting: Cardiovascular Disease

## 2018-05-13 ENCOUNTER — Encounter: Payer: Self-pay | Admitting: Cardiovascular Disease

## 2018-05-13 VITALS — BP 120/74 | HR 74 | Wt 201.0 lb

## 2018-05-13 DIAGNOSIS — I110 Hypertensive heart disease with heart failure: Secondary | ICD-10-CM

## 2018-05-13 DIAGNOSIS — I10 Essential (primary) hypertension: Secondary | ICD-10-CM | POA: Diagnosis not present

## 2018-05-13 DIAGNOSIS — R002 Palpitations: Secondary | ICD-10-CM | POA: Diagnosis not present

## 2018-05-13 DIAGNOSIS — R0609 Other forms of dyspnea: Secondary | ICD-10-CM

## 2018-05-13 DIAGNOSIS — R5383 Other fatigue: Secondary | ICD-10-CM

## 2018-05-13 DIAGNOSIS — I340 Nonrheumatic mitral (valve) insufficiency: Secondary | ICD-10-CM | POA: Diagnosis not present

## 2018-05-13 NOTE — Progress Notes (Signed)
CARDIOLOGY CONSULT NOTE  Patient ID: Jacqueline GrosLynn M Shimmin MRN: [id number] DOB/AGE: [birth year] 62 y.o.  Admit date: (Not on file) Primary Physician: Moses MannersHensel, William A, MD Referring Physician: Moses MannersHensel, William A, MD  Reason for Consultation: Palpitations  HPI: Jacqueline Hayes is a 62 y.o. female who is being seen today for the evaluation of palpitations at the request of Hensel, Santiago BumpersWilliam A, MD.  Past medical history includes hypertensive heart disease and mitral regurgitation.  I reviewed an echocardiogram performed on 07/10/2017 which demonstrated normal left ventricular systolic function and regional wall motion, LVEF 50 to 55%, mild LVH, grade 2 diastolic dysfunction, mild to moderate mitral regurgitation, and mild left atrial dilatation.  ECG performed in the office today which I ordered and personally interpreted demonstrates normal sinus rhythm with no ischemic ST segment or T-wave abnormalities, nor any arrhythmias.  I reviewed lipids dated 07/05/2017: Total cholesterol 214, triglycerides 151, HDL 68, LDL 116.  TSH normal on 07/05/2017.  BUN 18, creatinine 0.56 at that time.  She used to exercise by walking on the treadmill fairly routinely with [facility] but she began experiencing "weak spells "and shortness of breath with exertion.  She denies exertional chest pain.  She continues to have recurrent episodes where she feels weak and feels like she can feel her pulse in her ears.  She is checked her blood pressure during these episodes and they are in the 170/90 range.  She said her heart rate normally runs lower so she experienced palpitations when it is in the 70 bpm range.  She denies orthopnea and paroxysmal nocturnal dyspnea but has had some bilateral ankle and feet swelling.  It used to be worse until she started Lasix about a year ago.  Family history: Mother had CHF but developed it later in life and died at the age of 64.  Father had coronary artery disease  and was a smoker and developed this at the age of 64 and died at the age of 64.  She has 2 sisters who passed away of strokes in their 5670s.  She has an older sister who has hypertension.  She recently had a cousin who underwent four-vessel CABG at age 64.   Allergies  Allergen Reactions  . Amlodipine     Leg swelling  . Atenolol     bradycardia  . Catapres [Clonidine Hydrochloride]     Blood pressure dropped too low after 1st dose  . Clonidine Hcl   . Lisinopril     REACTION: ? ACE cough in the past?  . Macrobid [Nitrofurantoin Monohydrate Macrocrystals]     Causes elevation of liver enzymes  . Nitrofurantoin     Other reaction(s): Liver Disorder  . Nitrofurantoin Monohyd Macro   . Sulfa Antibiotics     Had joint aches after septra Rx.  Maybe related    Current Outpatient Medications  Medication Sig Dispense Refill  . alendronate (FOSAMAX) 70 MG tablet Take 1 tablet (70 mg total) by mouth every 7 (seven) days. Take with a full glass of water on an empty stomach. 4 tablet 11  . aspirin 81 MG tablet Take 81 mg by mouth daily.     . Calcium Carbonate (CALTRATE 600) 1500 MG TABS Take 1 tablet by mouth. Calcium 1200 mg every other day.    . Calcium Citrate-Vitamin D (CALCIUM CITRATE + D3 PO) Take by mouth. Take 1200 mg every other day    . Cholecalciferol (VITAMIN D3) 2000  UNITS TABS Take 1 tablet by mouth daily.    . furosemide (LASIX) 20 MG tablet Take 1 tablet (20 mg total) by mouth daily. 90 tablet 3  . KLOR-CON M10 10 MEQ tablet TAKE 1 AND 1/2 TABLETS BY MOUTH EVERY DAY 135 tablet 3  . levothyroxine (SYNTHROID, LEVOTHROID) 125 MCG tablet Take 1 tablet (125 mcg total) by mouth daily. 90 tablet 3  . LORazepam (ATIVAN) 1 MG tablet Take 1 tablet (1 mg total) by mouth at bedtime. 90 tablet 3  . losartan (COZAAR) 100 MG tablet Take 1 tablet (100 mg total) by mouth daily. 90 tablet 3  . Magnesium 400 MG CAPS Take 250 mg by mouth 3 (three) times daily.     . Multiple Vitamin  (MULTIVITAMIN) tablet Take 2 tablets by mouth daily. Complete with Iron Childrens Chewable vitamin-Take 2 daily    . Multiple Vitamins-Minerals (ZINC PO) Take 10 mg by mouth.    . oseltamivir (TAMIFLU) 75 MG capsule Take 1 capsule (75 mg total) by mouth daily. 10 capsule 0  . vitamin B-12 (CYANOCOBALAMIN) 1000 MCG tablet Place 2,000 mcg under the tongue daily.     Marland Kitchen zoster vaccine live, PF, (ZOSTAVAX) 25366 UNT/0.65ML injection Inject 19,400 Units into the skin once. 1 each 0   No current facility-administered medications for this visit.     Past Medical History:  Diagnosis Date  . History of kidney stones   . Hypertension   . Thyroid disease     Past Surgical History:  Procedure Laterality Date  . FOOT SURGERY     as a child/left foot  . GASTRIC BYPASS  04/2008  . GASTRIC BYPASS    . NASAL SEPTUM SURGERY  1988  . partial cornea transplant  2019  . TONSILLECTOMY     1963    Social History   Socioeconomic History  . Marital status: Married    Spouse name: Not on file  . Number of children: Not on file  . Years of education: Not on file  . Highest education level: Not on file  Occupational History  . Not on file  Social Needs  . Financial resource strain: Not on file  . Food insecurity:    Worry: Not on file    Inability: Not on file  . Transportation needs:    Medical: Not on file    Non-medical: Not on file  Tobacco Use  . Smoking status: Never Smoker  . Smokeless tobacco: Never Used  Substance and Sexual Activity  . Alcohol use: No    Alcohol/week: 0.0 oz  . Drug use: No  . Sexual activity: Not on file    Comment: married and monogamous  Lifestyle  . Physical activity:    Days per week: Not on file    Minutes per session: Not on file  . Stress: Not on file  Relationships  . Social connections:    Talks on phone: Not on file    Gets together: Not on file    Attends religious service: Not on file    Active member of club or organization: Not on file     Attends meetings of clubs or organizations: Not on file    Relationship status: Not on file  . Intimate partner violence:    Fear of current or ex partner: Not on file    Emotionally abused: Not on file    Physically abused: Not on file    Forced sexual activity: Not on file  Other Topics  Concern  . Not on file  Social History Narrative   ** Merged History Encounter **          Current Meds  Medication Sig  . alendronate (FOSAMAX) 70 MG tablet Take 1 tablet (70 mg total) by mouth every 7 (seven) days. Take with a full glass of water on an empty stomach.  Marland Kitchen aspirin 81 MG tablet Take 81 mg by mouth daily.   . Calcium Carbonate (CALTRATE 600) 1500 MG TABS Take 1 tablet by mouth. Calcium 1200 mg every other day.  . Calcium Citrate-Vitamin D (CALCIUM CITRATE + D3 PO) Take by mouth. Take 1200 mg every other day  . Cholecalciferol (VITAMIN D3) 2000 UNITS TABS Take 1 tablet by mouth daily.  . furosemide (LASIX) 20 MG tablet Take 1 tablet (20 mg total) by mouth daily.  Marland Kitchen KLOR-CON M10 10 MEQ tablet TAKE 1 AND 1/2 TABLETS BY MOUTH EVERY DAY  . levothyroxine (SYNTHROID, LEVOTHROID) 125 MCG tablet Take 1 tablet (125 mcg total) by mouth daily.  Marland Kitchen LORazepam (ATIVAN) 1 MG tablet Take 1 tablet (1 mg total) by mouth at bedtime.  Marland Kitchen losartan (COZAAR) 100 MG tablet Take 1 tablet (100 mg total) by mouth daily.  . Magnesium 400 MG CAPS Take 250 mg by mouth 3 (three) times daily.   . Multiple Vitamin (MULTIVITAMIN) tablet Take 2 tablets by mouth daily. Complete with Iron Childrens Chewable vitamin-Take 2 daily  . Multiple Vitamins-Minerals (ZINC PO) Take 10 mg by mouth.  . oseltamivir (TAMIFLU) 75 MG capsule Take 1 capsule (75 mg total) by mouth daily.  . vitamin B-12 (CYANOCOBALAMIN) 1000 MCG tablet Place 2,000 mcg under the tongue daily.   Marland Kitchen zoster vaccine live, PF, (ZOSTAVAX) 16109 UNT/0.65ML injection Inject 19,400 Units into the skin once.      Review of systems complete and found to be negative  unless listed above in HPI    Physical exam Blood pressure 120/74, pulse 74, weight 201 lb (91.2 kg), SpO2 97 %. General: NAD Neck: No JVD, no thyromegaly or thyroid nodule.  Lungs: Clear to auscultation bilaterally with normal respiratory effort. CV: Nondisplaced PMI. Regular rate and rhythm, normal S1/S2, no S3/S4, no murmur.  Trace bilateral peri-ankle and dorsal pedal edema.  No carotid bruit.    Abdomen: Soft, nontender, no distention.  Skin: Intact without lesions or rashes.  Neurologic: Alert and oriented x 3.  Psych: Normal affect. Extremities: No clubbing or cyanosis.  HEENT: Normal.   ECG: Most recent ECG reviewed.   Labs: Lab Results  Component Value Date/Time   K 4.2 08/29/2017 03:18 PM   BUN 17 08/29/2017 03:18 PM   CREATININE 0.60 08/29/2017 03:18 PM   CREATININE 0.57 04/06/2016 10:03 AM   ALT 17 07/05/2017 10:15 AM   TSH 1.270 07/05/2017 10:15 AM   HGB 13.2 07/05/2017 10:15 AM     Lipids: Lab Results  Component Value Date/Time   LDLCALC 116 (H) 07/05/2017 10:15 AM   LDLDIRECT 113 (H) 01/03/2007 08:19 PM   CHOL 214 (H) 07/05/2017 10:15 AM   TRIG 151 (H) 07/05/2017 10:15 AM   HDL 68 07/05/2017 10:15 AM        ASSESSMENT AND PLAN:   1.  Hypertensive heart disease with grade 2 diastolic dysfunction/chronic diastolic heart failure: Blood pressure is controlled at the present time and she appears euvolemic on Lasix 20 mg daily.  No changes to therapy.  2.  Palpitations: Given history of left atrial dilatation, chronic diastolic heart failure with hypertensive  heart disease, and mitral regurgitation, she is certainly at risk for atrial fibrillation.  I will obtain a 3-week event monitor.  3.  Mitral regurgitation: Noted is mild to moderate in August 2018 as detailed above.  I will monitor.  4.  Hypertension: Blood pressure is controlled.  No changes to therapy.  5.  Exertional dyspnea and fatigue: She has a strong family history of heart disease and  certainly has cardiac risk factors.  I will obtain an exercise Myoview stress test to evaluate for ischemic heart disease.   Disposition: Follow up in 8 weeks   Signed: Prentice Docker, M.D., F.A.C.C.  05/13/2018, 10:16 AM

## 2018-05-13 NOTE — Patient Instructions (Addendum)
Your physician wants you to follow-up in: 8 weeks with Dr.Koneswaran    Your physician recommends that you continue on your current medications as directed. Please refer to the Current Medication list given to you today.    Your physician has recommended that you wear an event monitor for 21 days. Event monitors are medical devices that record the heart's electrical activity. Doctors most often us these monitors to diagnose arrhythmias. Arrhythmias are problems with the speed or rhythm of the heartbeat. The monitor is a small, portable device. You can wear one while you do your normal daily activities. This is usually used to diagnose what is causing palpitations/syncope (passing out).    Your physician has requested that you have en exercise stress myoview. For further information please visit [email or web address]. Please follow instruction sheet, as given.     No lab work today.    If you need a refill on your cardiac medications before your next appointment, please call your pharmacy.      Thank you for choosing [facility] !

## 2018-05-13 NOTE — Progress Notes (Signed)
ekg 

## 2018-05-18 ENCOUNTER — Ambulatory Visit (INDEPENDENT_AMBULATORY_CARE_PROVIDER_SITE_OTHER): Payer: BC Managed Care – PPO

## 2018-05-18 DIAGNOSIS — R002 Palpitations: Secondary | ICD-10-CM | POA: Diagnosis not present

## 2018-05-20 ENCOUNTER — Encounter (HOSPITAL_BASED_OUTPATIENT_CLINIC_OR_DEPARTMENT_OTHER)
Admission: RE | Admit: 2018-05-20 | Discharge: 2018-05-20 | Disposition: A | Discharge status: Home / Self Care | Payer: BC Managed Care – PPO | Source: Ambulatory Visit | Attending: Cardiovascular Disease | Admitting: Cardiovascular Disease

## 2018-05-20 ENCOUNTER — Encounter (HOSPITAL_COMMUNITY): Payer: Self-pay

## 2018-05-20 ENCOUNTER — Encounter (HOSPITAL_COMMUNITY)
Admission: RE | Admit: 2018-05-20 | Discharge: 2018-05-20 | Disposition: A | Discharge status: Home / Self Care | Payer: BC Managed Care – PPO | Source: Ambulatory Visit | Attending: Cardiovascular Disease | Admitting: Cardiovascular Disease

## 2018-05-20 DIAGNOSIS — R0609 Other forms of dyspnea: Secondary | ICD-10-CM | POA: Diagnosis not present

## 2018-05-20 LAB — NM MYOCAR MULTI W/SPECT W/WALL MOTION / EF
CHL CUP MPHR: 157 {beats}/min
CHL CUP NUCLEAR SDS: 3
CHL CUP NUCLEAR SRS: 2
CHL CUP RESTING HR STRESS: 62 {beats}/min
CHL RATE OF PERCEIVED EXERTION: 13
CSEPED: 8 min
CSEPEDS: 0 s
CSEPEW: 9.5 METS
CSEPHR: 92 %
CSEPPHR: 146 {beats}/min
LV dias vol: 69 mL (ref 46–106)
LV sys vol: 26 mL
RATE: 0.36
SSS: 5
TID: 0.87

## 2018-05-20 MED ORDER — TECHNETIUM TC 99M TETROFOSMIN IV KIT
30.0000 | PACK | Freq: Once | INTRAVENOUS | Status: AC | PRN
  Administered 2018-05-20: 26 via INTRAVENOUS

## 2018-05-20 MED ORDER — REGADENOSON 0.4 MG/5ML IV SOLN
INTRAVENOUS | Status: AC
  Filled 2018-05-20: qty 5

## 2018-05-20 MED ORDER — TECHNETIUM TC 99M TETROFOSMIN IV KIT
10.0000 | PACK | Freq: Once | INTRAVENOUS | Status: AC | PRN
  Administered 2018-05-20: 9 via INTRAVENOUS

## 2018-05-20 MED ORDER — SODIUM CHLORIDE 0.9% FLUSH
INTRAVENOUS | Status: AC
  Administered 2018-05-20: 10 mL via INTRAVENOUS
  Filled 2018-05-20: qty 10

## 2018-06-13 ENCOUNTER — Other Ambulatory Visit: Payer: Self-pay | Admitting: Family Medicine

## 2018-06-13 DIAGNOSIS — I5032 Chronic diastolic (congestive) heart failure: Secondary | ICD-10-CM

## 2018-06-24 ENCOUNTER — Other Ambulatory Visit: Payer: Self-pay | Admitting: Family Medicine

## 2018-06-24 DIAGNOSIS — I10 Essential (primary) hypertension: Secondary | ICD-10-CM

## 2018-06-24 DIAGNOSIS — E039 Hypothyroidism, unspecified: Secondary | ICD-10-CM

## 2018-07-10 ENCOUNTER — Encounter: Payer: Self-pay | Admitting: Cardiovascular Disease

## 2018-07-10 ENCOUNTER — Ambulatory Visit (INDEPENDENT_AMBULATORY_CARE_PROVIDER_SITE_OTHER): Payer: BC Managed Care – PPO | Admitting: Cardiovascular Disease

## 2018-07-10 VITALS — BP 128/76 | HR 63 | Ht 64.0 in | Wt 203.0 lb

## 2018-07-10 DIAGNOSIS — I340 Nonrheumatic mitral (valve) insufficiency: Secondary | ICD-10-CM | POA: Diagnosis not present

## 2018-07-10 DIAGNOSIS — I110 Hypertensive heart disease with heart failure: Secondary | ICD-10-CM | POA: Diagnosis not present

## 2018-07-10 DIAGNOSIS — I5032 Chronic diastolic (congestive) heart failure: Secondary | ICD-10-CM

## 2018-07-10 DIAGNOSIS — I10 Essential (primary) hypertension: Secondary | ICD-10-CM

## 2018-07-10 DIAGNOSIS — R002 Palpitations: Secondary | ICD-10-CM | POA: Diagnosis not present

## 2018-07-10 NOTE — Patient Instructions (Signed)

## 2018-07-10 NOTE — Progress Notes (Signed)
SUBJECTIVE: The patient returns for follow-up after undergoing cardiovascular testing performed for the evaluation of exertional dyspnea and palpitations.  Nuclear stress test on 05/20/2018 was a low risk study, EF 63%.  There was a hypertensive response to exercise noted.  There were no arrhythmias.  Soft tissue attenuation was seen.  Event monitoring demonstrated sinus rhythm, sinus arrhythmia, and sinus tachycardia with isolated PACs.  Past medical history includes hypertensive heart disease and mitral regurgitation.  Echocardiogram performed on 07/10/2017 demonstrated normal left ventricular systolic function and regional wall motion, LVEF 50 to 55%, mild LVH, grade 2 diastolic dysfunction, mild to moderate mitral regurgitation, and mild left atrial dilatation.  She is feeling better and denies having any further weak spells.  She denies palpitations, chest pain, leg swelling, orthopnea, paroxysmal nocturnal dyspnea.     Review of Systems: As per "subjective", otherwise negative.  Allergies  Allergen Reactions  . Amlodipine     Leg swelling  . Atenolol     bradycardia  . Catapres [Clonidine Hydrochloride]     Blood pressure dropped too low after 1st dose  . Clonidine Hcl   . Lisinopril     REACTION: ? ACE cough in the past?  . Macrobid [Nitrofurantoin Monohydrate Macrocrystals]     Causes elevation of liver enzymes  . Nitrofurantoin     Other reaction(s): Liver Disorder  . Nitrofurantoin Monohyd Macro   . Sulfa Antibiotics     Had joint aches after septra Rx.  Maybe related    Current Outpatient Medications  Medication Sig Dispense Refill  . alendronate (FOSAMAX) 70 MG tablet Take 1 tablet (70 mg total) by mouth every 7 (seven) days. Take with a full glass of water on an empty stomach. 4 tablet 11  . aspirin 81 MG tablet Take 81 mg by mouth daily.     . Calcium Carbonate (CALTRATE 600) 1500 MG TABS Take 1 tablet by mouth. Calcium 1200 mg every other day.    .  Calcium Citrate-Vitamin D (CALCIUM CITRATE + D3 PO) Take by mouth. Take 1200 mg every other day    . Cholecalciferol (VITAMIN D3) 2000 UNITS TABS Take 1 tablet by mouth daily.    . furosemide (LASIX) 20 MG tablet TAKE 1 TABLET BY MOUTH EVERY DAY 90 tablet 3  . KLOR-CON M10 10 MEQ tablet TAKE 1 AND 1/2 TABLETS BY MOUTH EVERY DAY 135 tablet 3  . levothyroxine (SYNTHROID, LEVOTHROID) 125 MCG tablet TAKE (1) TABLET BY MOUTH ONCE DAILY. 90 tablet 0  . LORazepam (ATIVAN) 1 MG tablet Take 1 tablet (1 mg total) by mouth at bedtime. 90 tablet 3  . losartan (COZAAR) 100 MG tablet TAKE (1) TABLET BY MOUTH ONCE DAILY. 90 tablet 0  . Magnesium 400 MG CAPS Take 250 mg by mouth 3 (three) times daily.     . Multiple Vitamin (MULTIVITAMIN) tablet Take 2 tablets by mouth daily. Complete with Iron Childrens Chewable vitamin-Take 2 daily    . Multiple Vitamins-Minerals (ZINC PO) Take 10 mg by mouth.    . oseltamivir (TAMIFLU) 75 MG capsule Take 1 capsule (75 mg total) by mouth daily. 10 capsule 0  . vitamin B-12 (CYANOCOBALAMIN) 1000 MCG tablet Place 2,000 mcg under the tongue daily.     Marland Kitchen. zoster vaccine live, PF, (ZOSTAVAX) [id number] UNT/0.65ML injection Inject 19,400 Units into the skin once. 1 each 0   No current facility-administered medications for this visit.     Past Medical History:  Diagnosis Date  .  History of kidney stones   . Hypertension   . Thyroid disease     Past Surgical History:  Procedure Laterality Date  . FOOT SURGERY     as a child/left foot  . GASTRIC BYPASS  04/2008  . GASTRIC BYPASS    . NASAL SEPTUM SURGERY  1988  . partial cornea transplant  2019  . TONSILLECTOMY     1963    Social History   Socioeconomic History  . Marital status: Married    Spouse name: Not on file  . Number of children: Not on file  . Years of education: Not on file  . Highest education level: Not on file  Occupational History  . Not on file  Social Needs  . Financial resource strain: Not on file    . Food insecurity:    Worry: Not on file    Inability: Not on file  . Transportation needs:    Medical: Not on file    Non-medical: Not on file  Tobacco Use  . Smoking status: Never Smoker  . Smokeless tobacco: Never Used  Substance and Sexual Activity  . Alcohol use: No    Alcohol/week: 0.0 standard drinks  . Drug use: No  . Sexual activity: Not on file    Comment: married and monogamous  Lifestyle  . Physical activity:    Days per week: Not on file    Minutes per session: Not on file  . Stress: Not on file  Relationships  . Social connections:    Talks on phone: Not on file    Gets together: Not on file    Attends religious service: Not on file    Active member of club or organization: Not on file    Attends meetings of clubs or organizations: Not on file    Relationship status: Not on file  . Intimate partner violence:    Fear of current or ex partner: Not on file    Emotionally abused: Not on file    Physically abused: Not on file    Forced sexual activity: Not on file  Other Topics Concern  . Not on file  Social History Narrative   ** Merged History Encounter **         Vitals:   07/10/18 1054  BP: 128/76  Pulse: 63  SpO2: 98%  Weight: 203 lb (92.1 kg)  Height: 5\' 4"  (1.626 m)    Wt Readings from Last 3 Encounters:  07/10/18 203 lb (92.1 kg)  05/13/18 201 lb (91.2 kg)  02/27/18 194 lb 12.8 oz (88.4 kg)     PHYSICAL EXAM General: NAD HEENT: Normal. Neck: No JVD, no thyromegaly. Lungs: Clear to auscultation bilaterally with normal respiratory effort. CV: Regular rate and rhythm, normal S1/S2, no S3/S4, no murmur. No pretibial or periankle edema.    Abdomen: Soft, nontender, no distention.  Neurologic: Alert and oriented.  Psych: Normal affect. Skin: Normal. Musculoskeletal: No gross deformities.    ECG: Reviewed above under Subjective   Labs: Lab Results  Component Value Date/Time   K 4.2 08/29/2017 03:18 PM   BUN 17 08/29/2017 03:18  PM   CREATININE 0.60 08/29/2017 03:18 PM   CREATININE 0.57 04/06/2016 10:03 AM   ALT 17 07/05/2017 10:15 AM   TSH 1.270 07/05/2017 10:15 AM   HGB 13.2 07/05/2017 10:15 AM     Lipids: Lab Results  Component Value Date/Time   LDLCALC 116 (H) 07/05/2017 10:15 AM   LDLDIRECT 113 (H) 01/03/2007 08:19 PM  CHOL 214 (H) 07/05/2017 10:15 AM   TRIG 151 (H) 07/05/2017 10:15 AM   HDL 68 07/05/2017 10:15 AM       ASSESSMENT AND PLAN:  1.  Hypertensive heart disease with grade 2 diastolic dysfunction/chronic diastolic heart failure: Blood pressure is controlled at the present time and she appears euvolemic on Lasix 20 mg daily.  No changes to therapy.  2.  Palpitations: Given history of left atrial dilatation, chronic diastolic heart failure with hypertensive heart disease, and mitral regurgitation, she is certainly at risk for atrial fibrillation.  That being said, event monitor was unremarkable as reviewed above.  No further testing is indicated at this time.  3.  Mitral regurgitation: Noted is mild to moderate in August 2018 as detailed above.  I will monitor.  4.  Hypertension: Blood pressure is controlled.  No changes to therapy.  5.  Exertional dyspnea and fatigue: Symptomatic improvement since last visit.  Low risk nuclear stress test as detailed above.  No further cardiac testing is indicated at this time.   Disposition: Follow up 6 months   Prentice DockerSuresh Koneswaran, M.D., F.A.C.C.

## 2018-09-05 ENCOUNTER — Other Ambulatory Visit: Payer: Self-pay | Admitting: Family Medicine

## 2018-09-05 DIAGNOSIS — F419 Anxiety disorder, unspecified: Secondary | ICD-10-CM

## 2018-09-15 ENCOUNTER — Other Ambulatory Visit: Payer: Self-pay | Admitting: Family Medicine

## 2018-09-15 DIAGNOSIS — E876 Hypokalemia: Secondary | ICD-10-CM

## 2018-09-18 ENCOUNTER — Other Ambulatory Visit: Payer: Self-pay | Admitting: Family Medicine

## 2018-09-18 DIAGNOSIS — I10 Essential (primary) hypertension: Secondary | ICD-10-CM

## 2018-10-10 ENCOUNTER — Encounter: Payer: Self-pay | Admitting: Family Medicine

## 2018-10-10 ENCOUNTER — Ambulatory Visit (INDEPENDENT_AMBULATORY_CARE_PROVIDER_SITE_OTHER): Payer: BC Managed Care – PPO | Admitting: Family Medicine

## 2018-10-10 ENCOUNTER — Other Ambulatory Visit: Payer: Self-pay

## 2018-10-10 DIAGNOSIS — Z0000 Encounter for general adult medical examination without abnormal findings: Secondary | ICD-10-CM

## 2018-10-10 DIAGNOSIS — E039 Hypothyroidism, unspecified: Secondary | ICD-10-CM

## 2018-10-10 DIAGNOSIS — I5032 Chronic diastolic (congestive) heart failure: Secondary | ICD-10-CM

## 2018-10-10 DIAGNOSIS — M810 Age-related osteoporosis without current pathological fracture: Secondary | ICD-10-CM

## 2018-10-10 DIAGNOSIS — I10 Essential (primary) hypertension: Secondary | ICD-10-CM | POA: Diagnosis not present

## 2018-10-10 DIAGNOSIS — E7800 Pure hypercholesterolemia, unspecified: Secondary | ICD-10-CM

## 2018-10-10 DIAGNOSIS — Z9884 Bariatric surgery status: Secondary | ICD-10-CM

## 2018-10-10 NOTE — Progress Notes (Signed)
   Subjective:    Patient ID: Jacqueline Hayes, female    DOB: February 01, 1956, 62 y.o.   MRN: [id number]  HPI  Annual physical: Issues 1. Recent Dx of osteoporosis.  Tolerating fosamax without problems.   2. S/P bariatric surgery.  She is at risk for several malabsorbtions.  3. Hypertension. Tolerating meds well without complaints. 4. CHF - Tolerating furosemide well.  No DOE.  Does have mild ankle edema.   5. Hypothyroidism.  On replacement. Due for TSH. 6. Hx of mild LDL elevation.  VERY strong family history of CAD.  Primary prevention.  Diet: Generally healthy. Activity: Primarily walking.  With osteoporosis, knows she needs to add resistance training.  HPDP up to date.    Review of Systems Denies CP DOE, dysphagia, weakness, numbness, new headaches.  Denies change in appetite, weight, bowel, bladder.        Objective:   Physical ExamVS noted HEENT WNL Neck without masses Lungs clear Cardiac rRR without m or g Abd bening Ext bilateral trace edema.        Assessment & Plan:

## 2018-10-10 NOTE — Patient Instructions (Signed)
You seem to be doing great. I will call with lab results. Specifically, I will run the numbers to see if you need to be on a statin. Keep working on exercise.  Pickleball is fun. Happy Holidays.

## 2018-10-10 NOTE — Assessment & Plan Note (Signed)
Healthy female with no at risk behavior.

## 2018-10-11 ENCOUNTER — Encounter: Payer: Self-pay | Admitting: Family Medicine

## 2018-10-11 LAB — CMP14+EGFR
ALBUMIN: 4.2 g/dL (ref 3.6–4.8)
ALK PHOS: 66 IU/L (ref 39–117)
ALT: 13 IU/L (ref 0–32)
AST: 16 IU/L (ref 0–40)
Albumin/Globulin Ratio: 2.2 (ref 1.2–2.2)
BILIRUBIN TOTAL: 0.4 mg/dL (ref 0.0–1.2)
BUN/Creatinine Ratio: 30 — ABNORMAL HIGH (ref 12–28)
BUN: 18 mg/dL (ref 8–27)
CHLORIDE: 99 mmol/L (ref 96–106)
CO2: 24 mmol/L (ref 20–29)
CREATININE: 0.61 mg/dL (ref 0.57–1.00)
Calcium: 9.1 mg/dL (ref 8.7–10.3)
GFR calc Af Amer: 112 mL/min/{1.73_m2} (ref >59)
GFR calc non Af Amer: 97 mL/min/{1.73_m2} (ref >59)
GLOBULIN, TOTAL: 1.9 g/dL (ref 1.5–4.5)
Glucose: 88 mg/dL (ref 65–99)
Potassium: 4.1 mmol/L (ref 3.5–5.2)
Sodium: 143 mmol/L (ref 134–144)
Total Protein: 6.1 g/dL (ref 6.0–8.5)

## 2018-10-11 LAB — CBC
Hematocrit: 38 % (ref 34.0–46.6)
Hemoglobin: 12.5 g/dL (ref 11.1–15.9)
MCH: 30.6 pg (ref 26.6–33.0)
MCHC: 32.9 g/dL (ref 31.5–35.7)
MCV: 93 fL (ref 79–97)
Platelets: 189 10*3/uL (ref 150–450)
RBC: 4.08 x10E6/uL (ref 3.77–5.28)
RDW: 12.8 % (ref 12.3–15.4)
WBC: 3.4 10*3/uL (ref 3.4–10.8)

## 2018-10-11 LAB — LIPID PANEL
CHOLESTEROL TOTAL: 210 mg/dL — AB (ref 100–199)
Chol/HDL Ratio: 3 ratio (ref 0.0–4.4)
HDL: 69 mg/dL (ref >39)
LDL CALC: 115 mg/dL — AB (ref 0–99)
TRIGLYCERIDES: 128 mg/dL (ref 0–149)
VLDL CHOLESTEROL CAL: 26 mg/dL (ref 5–40)

## 2018-10-11 LAB — FERRITIN: FERRITIN: 123 ng/mL (ref 15–150)

## 2018-10-11 LAB — VITAMIN D 25 HYDROXY (VIT D DEFICIENCY, FRACTURES): Vit D, 25-Hydroxy: 38.8 ng/mL (ref 30.0–100.0)

## 2018-10-11 LAB — TSH: TSH: 2.22 u[IU]/mL (ref 0.450–4.500)

## 2018-10-11 MED ORDER — ALENDRONATE SODIUM 70 MG PO TABS: 70.0000 mg | ORAL_TABLET | ORAL | 3 refills | Status: DC

## 2018-10-11 MED ORDER — FUROSEMIDE 20 MG PO TABS: 20.0000 mg | ORAL_TABLET | Freq: Every day | ORAL | 3 refills | Status: DC

## 2018-10-11 MED ORDER — LOSARTAN POTASSIUM 100 MG PO TABS: ORAL_TABLET | ORAL | 3 refills | Status: DC

## 2018-10-11 MED ORDER — PRAVASTATIN SODIUM 40 MG PO TABS: 40.0000 mg | ORAL_TABLET | Freq: Every day | ORAL | 3 refills | Status: DC

## 2018-10-11 MED ORDER — LEVOTHYROXINE SODIUM 125 MCG PO TABS: ORAL_TABLET | ORAL | 3 refills | Status: DC

## 2018-10-11 NOTE — Assessment & Plan Note (Signed)
Tolerating fosamax well.

## 2018-10-11 NOTE — Assessment & Plan Note (Signed)
Stable on current meds 

## 2018-10-11 NOTE — Assessment & Plan Note (Signed)
Check labs 

## 2018-10-11 NOTE — Assessment & Plan Note (Signed)
10 year ASCVD risk = 6.2%, but strong family history

## 2018-10-16 ENCOUNTER — Telehealth: Payer: Self-pay | Admitting: *Deleted

## 2018-10-16 NOTE — Telephone Encounter (Signed)
Already reviewed and signed labs, so they should be in MyChart.

## 2018-10-16 NOTE — Telephone Encounter (Signed)
Pt is requesting to have her lab results released to mychart. Fleeger, Maryjo RochesterJessica Dawn, CMA

## 2018-12-05 ENCOUNTER — Other Ambulatory Visit: Payer: Self-pay | Admitting: Family Medicine

## 2018-12-05 DIAGNOSIS — E039 Hypothyroidism, unspecified: Secondary | ICD-10-CM

## 2018-12-23 ENCOUNTER — Telehealth: Payer: Self-pay

## 2018-12-23 MED ORDER — OSELTAMIVIR PHOSPHATE 75 MG PO CAPS: 75.0000 mg | ORAL_CAPSULE | Freq: Two times a day (BID) | ORAL | 0 refills | Status: DC

## 2018-12-23 NOTE — Telephone Encounter (Signed)
Done

## 2018-12-23 NOTE — Telephone Encounter (Signed)
Pt called nurse line stating she has been exposed to influenza A. Pt stated both her son and grandchild tested positive today. Pt is requesting a tamiflu to be sent to her pharmacy- [facility] in [place]. Please advise.

## 2018-12-23 NOTE — Telephone Encounter (Signed)
Patient notified

## 2019-01-08 ENCOUNTER — Other Ambulatory Visit: Payer: Self-pay | Admitting: Family Medicine

## 2019-01-08 DIAGNOSIS — Z1231 Encounter for screening mammogram for malignant neoplasm of breast: Secondary | ICD-10-CM

## 2019-01-23 ENCOUNTER — Other Ambulatory Visit: Payer: Self-pay | Admitting: Family Medicine

## 2019-01-23 DIAGNOSIS — M810 Age-related osteoporosis without current pathological fracture: Secondary | ICD-10-CM

## 2019-02-24 ENCOUNTER — Ambulatory Visit: Payer: BC Managed Care – PPO

## 2019-04-02 ENCOUNTER — Other Ambulatory Visit: Payer: Self-pay

## 2019-04-02 ENCOUNTER — Telehealth (INDEPENDENT_AMBULATORY_CARE_PROVIDER_SITE_OTHER): Payer: BC Managed Care – PPO | Admitting: Family Medicine

## 2019-04-02 DIAGNOSIS — M25561 Pain in right knee: Secondary | ICD-10-CM

## 2019-04-02 DIAGNOSIS — M25562 Pain in left knee: Secondary | ICD-10-CM

## 2019-04-02 DIAGNOSIS — M791 Myalgia, unspecified site: Secondary | ICD-10-CM | POA: Diagnosis not present

## 2019-04-02 DIAGNOSIS — I872 Venous insufficiency (chronic) (peripheral): Secondary | ICD-10-CM

## 2019-04-02 DIAGNOSIS — T466X5A Adverse effect of antihyperlipidemic and antiarteriosclerotic drugs, initial encounter: Secondary | ICD-10-CM

## 2019-04-02 NOTE — Progress Notes (Signed)
Agrees to telephone visit.  BP= 140/83 P=69 T=97.6  Left knee pain x 5 weeks Relieved by ibuprofen.  Maybe improving some.  Worse with going up steps. No knee trauma.  Did start walking more which seemed to bring on the pain.  No fever, no redness.  Aleve helps.  X rays of right knee 3 years ago showed osteoarthritis.  Has her chronic bilateral leg swelling which is not worse.    Not taking pravastatin due to pain.  It was for primary prevention.  Duration of phone call was 25 minutes.

## 2019-04-02 NOTE — Assessment & Plan Note (Signed)
OK to use aleve (creat OK and no hx of GI bleed.)  Rec capsicum topical  Almost certainly osteoarthritis.  Discussed finding the sweet spot with exercise

## 2019-04-02 NOTE — Assessment & Plan Note (Signed)
Given this is unchanged, I do not think she needs any DVT WU

## 2019-04-02 NOTE — Assessment & Plan Note (Signed)
Stop statin.  Was for primary prevention.

## 2019-05-05 ENCOUNTER — Other Ambulatory Visit: Payer: Self-pay | Admitting: Family Medicine

## 2019-05-05 DIAGNOSIS — G4700 Insomnia, unspecified: Secondary | ICD-10-CM | POA: Insufficient documentation

## 2019-05-05 DIAGNOSIS — F419 Anxiety disorder, unspecified: Secondary | ICD-10-CM

## 2019-05-05 NOTE — Assessment & Plan Note (Signed)
Discussed with patient.  Will continue lorazepam

## 2019-06-09 ENCOUNTER — Ambulatory Visit: Payer: BC Managed Care – PPO

## 2019-06-10 ENCOUNTER — Ambulatory Visit
Admission: RE | Admit: 2019-06-10 | Discharge: 2019-06-10 | Disposition: A | Discharge status: Home / Self Care | Payer: BC Managed Care – PPO | Source: Ambulatory Visit | Attending: Family Medicine | Admitting: Family Medicine

## 2019-06-10 ENCOUNTER — Other Ambulatory Visit: Payer: Self-pay

## 2019-06-10 DIAGNOSIS — Z1231 Encounter for screening mammogram for malignant neoplasm of breast: Secondary | ICD-10-CM

## 2019-06-19 ENCOUNTER — Encounter: Payer: Self-pay | Admitting: Family Medicine

## 2019-07-13 ENCOUNTER — Encounter: Payer: Self-pay | Admitting: Family Medicine

## 2019-07-17 ENCOUNTER — Encounter: Payer: Self-pay | Admitting: Family Medicine

## 2019-07-22 ENCOUNTER — Other Ambulatory Visit: Payer: Self-pay | Admitting: Family Medicine

## 2019-07-22 DIAGNOSIS — E876 Hypokalemia: Secondary | ICD-10-CM

## 2019-07-23 ENCOUNTER — Other Ambulatory Visit: Payer: Self-pay | Admitting: Family Medicine

## 2019-07-23 DIAGNOSIS — E039 Hypothyroidism, unspecified: Secondary | ICD-10-CM

## 2019-07-23 DIAGNOSIS — I10 Essential (primary) hypertension: Secondary | ICD-10-CM

## 2019-09-01 ENCOUNTER — Encounter: Payer: Self-pay | Admitting: Family Medicine

## 2019-10-06 ENCOUNTER — Other Ambulatory Visit: Payer: Self-pay

## 2019-10-06 ENCOUNTER — Encounter: Payer: Self-pay | Admitting: Family Medicine

## 2019-10-06 ENCOUNTER — Ambulatory Visit (INDEPENDENT_AMBULATORY_CARE_PROVIDER_SITE_OTHER): Payer: BC Managed Care – PPO | Admitting: Family Medicine

## 2019-10-06 DIAGNOSIS — M810 Age-related osteoporosis without current pathological fracture: Secondary | ICD-10-CM

## 2019-10-06 DIAGNOSIS — I10 Essential (primary) hypertension: Secondary | ICD-10-CM | POA: Diagnosis not present

## 2019-10-06 DIAGNOSIS — E876 Hypokalemia: Secondary | ICD-10-CM

## 2019-10-06 DIAGNOSIS — F419 Anxiety disorder, unspecified: Secondary | ICD-10-CM

## 2019-10-06 DIAGNOSIS — E039 Hypothyroidism, unspecified: Secondary | ICD-10-CM | POA: Diagnosis not present

## 2019-10-06 DIAGNOSIS — E7800 Pure hypercholesterolemia, unspecified: Secondary | ICD-10-CM

## 2019-10-06 DIAGNOSIS — I5032 Chronic diastolic (congestive) heart failure: Secondary | ICD-10-CM

## 2019-10-06 DIAGNOSIS — Z0000 Encounter for general adult medical examination without abnormal findings: Secondary | ICD-10-CM

## 2019-10-06 DIAGNOSIS — G4700 Insomnia, unspecified: Secondary | ICD-10-CM

## 2019-10-06 MED ORDER — FUROSEMIDE 20 MG PO TABS: 20.0000 mg | ORAL_TABLET | Freq: Every day | ORAL | 3 refills | Status: DC

## 2019-10-06 MED ORDER — LORAZEPAM 1 MG PO TABS: ORAL_TABLET | ORAL | 1 refills | Status: DC

## 2019-10-06 MED ORDER — CARVEDILOL 3.125 MG PO TABS: 3.1250 mg | ORAL_TABLET | Freq: Two times a day (BID) | ORAL | 3 refills | Status: DC

## 2019-10-06 MED ORDER — LOSARTAN POTASSIUM 100 MG PO TABS: 100.0000 mg | ORAL_TABLET | Freq: Every day | ORAL | 3 refills | Status: DC

## 2019-10-06 MED ORDER — POTASSIUM CHLORIDE CRYS ER 10 MEQ PO TBCR: 15.0000 meq | EXTENDED_RELEASE_TABLET | Freq: Every day | ORAL | 3 refills | Status: DC

## 2019-10-06 MED ORDER — LEVOTHYROXINE SODIUM 125 MCG PO TABS: 125.0000 ug | ORAL_TABLET | Freq: Every day | ORAL | 3 refills | Status: DC

## 2019-10-06 NOTE — Assessment & Plan Note (Signed)
Quit fosamax due to tooth pain.

## 2019-10-06 NOTE — Assessment & Plan Note (Addendum)
Previous pravastatin intolerance.  Consider retrial.  Calculated 10 y ASCVD risk =8.2% Called with results.  Will try every other day rosuvastatin.

## 2019-10-06 NOTE — Patient Instructions (Signed)
I will call with lab test results. We will likely take about trying another statin. I sent in another blood pressure medicine. Watch your heart rate and blood pressure.  Goal is most readings 140/90 or below.

## 2019-10-07 LAB — LIPID PANEL
Chol/HDL Ratio: 3 ratio (ref 0.0–4.4)
Cholesterol, Total: 210 mg/dL — ABNORMAL HIGH (ref 100–199)
HDL: 69 mg/dL (ref >39)
LDL Chol Calc (NIH): 107 mg/dL — ABNORMAL HIGH (ref 0–99)
Triglycerides: 202 mg/dL — ABNORMAL HIGH (ref 0–149)
VLDL Cholesterol Cal: 34 mg/dL (ref 5–40)

## 2019-10-07 LAB — CBC
Hematocrit: 41.7 % (ref 34.0–46.6)
Hemoglobin: 14 g/dL (ref 11.1–15.9)
MCH: 30.9 pg (ref 26.6–33.0)
MCHC: 33.6 g/dL (ref 31.5–35.7)
MCV: 92 fL (ref 79–97)
Platelets: 196 10*3/uL (ref 150–450)
RBC: 4.53 x10E6/uL (ref 3.77–5.28)
RDW: 12.6 % (ref 11.7–15.4)
WBC: 4 10*3/uL (ref 3.4–10.8)

## 2019-10-07 LAB — CMP14+EGFR
ALT: 12 IU/L (ref 0–32)
AST: 18 IU/L (ref 0–40)
Albumin/Globulin Ratio: 1.8 (ref 1.2–2.2)
Albumin: 4.4 g/dL (ref 3.8–4.8)
Alkaline Phosphatase: 79 IU/L (ref 39–117)
BUN/Creatinine Ratio: 26 (ref 12–28)
BUN: 18 mg/dL (ref 8–27)
Bilirubin Total: 0.4 mg/dL (ref 0.0–1.2)
CO2: 25 mmol/L (ref 20–29)
Calcium: 9.7 mg/dL (ref 8.7–10.3)
Chloride: 100 mmol/L (ref 96–106)
Creatinine, Ser: 0.68 mg/dL (ref 0.57–1.00)
GFR calc Af Amer: 107 mL/min/{1.73_m2} (ref >59)
GFR calc non Af Amer: 93 mL/min/{1.73_m2} (ref >59)
Globulin, Total: 2.5 g/dL (ref 1.5–4.5)
Glucose: 89 mg/dL (ref 65–99)
Potassium: 4 mmol/L (ref 3.5–5.2)
Sodium: 142 mmol/L (ref 134–144)
Total Protein: 6.9 g/dL (ref 6.0–8.5)

## 2019-10-07 LAB — TSH: TSH: 2 u[IU]/mL (ref 0.450–4.500)

## 2019-10-08 ENCOUNTER — Encounter: Payer: Self-pay | Admitting: Family Medicine

## 2019-10-08 MED ORDER — ROSUVASTATIN CALCIUM 10 MG PO TABS: 10.0000 mg | ORAL_TABLET | ORAL | 3 refills | Status: DC

## 2019-10-08 NOTE — Assessment & Plan Note (Signed)
Stable on current meds.  Aim for better BP control

## 2019-10-08 NOTE — Progress Notes (Signed)
  Subjective:     Patient ID: Jacqueline Hayes, female   DOB: 15-Apr-1956, 63 y.o.   MRN: [id number]  HPI Annual physical.   HBP: Home BPs have been running high.  No chest pain or SOB.  Tolerating current meds OK.  In past intollerant of amlodipine (leg swelling) and beta blocker (bradycardia.) ASCVD risk.  On ASA.  Due for lipid panel.  Primary prevention. Osteoporosis:  Quit fosamax after one year.  Seen by dentist and no osteonecrosis.  Does not want to restart.  She is taking calcium and vitamin D. HFpEF.  Chronic mild ankle swelling unchanged.  No SOB.  Remains on lasix and BP meds. HPDP  Up to date on vaccines.  Has had Pap smear by Dr. Edwyna Ready - will get records.  Discussed shingle vaccine.  She wants to delay for a few months because just got flu and pneumonia vaccines.  Needs to exercise more.  Has always struggled with wt.  S/P bariatric surgery.   Review of Systems No change in bowel, bladder, appetite or wt.  No bleeding, pain or worrisome skin changes.  Seen by derm for comprehensive skin exam    Objective:   Physical Exam  Heent normal Neck supple without thyromegally Cardiac RRR without m or g Abd benign Ext trace bilateral edema. Neuro: Affect good.  Motor, sensory, gait and mentation grossly intact.    Assessment:     See problems    Plan:     See problems

## 2019-10-08 NOTE — Assessment & Plan Note (Signed)
Healthy female who is up to date on HPDP measures.  Could use more exercise and wt loss.

## 2019-10-08 NOTE — Assessment & Plan Note (Signed)
Start low dose Beta blocker.  Watch for bradycardia.

## 2019-10-10 ENCOUNTER — Encounter: Payer: Self-pay | Admitting: Family Medicine

## 2019-10-13 ENCOUNTER — Encounter: Payer: Self-pay | Admitting: Family Medicine

## 2019-10-16 ENCOUNTER — Other Ambulatory Visit: Payer: Self-pay | Admitting: Family Medicine

## 2019-10-16 DIAGNOSIS — I5032 Chronic diastolic (congestive) heart failure: Secondary | ICD-10-CM

## 2019-12-19 ENCOUNTER — Ambulatory Visit: Payer: Self-pay

## 2019-12-26 ENCOUNTER — Ambulatory Visit: Payer: Self-pay

## 2019-12-27 ENCOUNTER — Ambulatory Visit: Payer: Self-pay

## 2019-12-28 ENCOUNTER — Ambulatory Visit: Payer: Self-pay

## 2020-01-01 ENCOUNTER — Ambulatory Visit: Payer: Self-pay

## 2020-01-05 ENCOUNTER — Ambulatory Visit: Payer: Self-pay

## 2020-03-01 ENCOUNTER — Encounter: Payer: Self-pay | Admitting: Cardiovascular Disease

## 2020-03-01 ENCOUNTER — Other Ambulatory Visit: Payer: Self-pay

## 2020-03-01 ENCOUNTER — Ambulatory Visit (INDEPENDENT_AMBULATORY_CARE_PROVIDER_SITE_OTHER): Payer: Medicare Other | Admitting: Cardiovascular Disease

## 2020-03-01 VITALS — BP 160/80 | HR 66 | Temp 96.0°F | Ht 64.0 in

## 2020-03-01 DIAGNOSIS — R002 Palpitations: Secondary | ICD-10-CM

## 2020-03-01 DIAGNOSIS — I10 Essential (primary) hypertension: Secondary | ICD-10-CM

## 2020-03-01 DIAGNOSIS — I5032 Chronic diastolic (congestive) heart failure: Secondary | ICD-10-CM

## 2020-03-01 DIAGNOSIS — I340 Nonrheumatic mitral (valve) insufficiency: Secondary | ICD-10-CM | POA: Diagnosis not present

## 2020-03-01 DIAGNOSIS — I110 Hypertensive heart disease with heart failure: Secondary | ICD-10-CM

## 2020-03-01 MED ORDER — HYDRALAZINE HCL 25 MG PO TABS: 25.0000 mg | ORAL_TABLET | Freq: Three times a day (TID) | ORAL | 3 refills | Status: DC

## 2020-03-01 NOTE — Patient Instructions (Signed)
Medication Instructions:  START Hydralazine 25 mg three times a day  *If you need a refill on your cardiac medications before your next appointment, please call your pharmacy*   Lab Work: None today If you have labs (blood work) drawn today and your tests are completely normal, you will receive your results only by: Marland Kitchen MyChart Message (if you have MyChart) OR . A paper copy in the mail If you have any lab test that is abnormal or we need to change your treatment, we will call you to review the results.   Testing/Procedures: Your physician has requested that you have an echocardiogram. Echocardiography is a painless test that uses sound waves to create images of your heart. It provides your doctor with information about the size and shape of your heart and how well your heart's chambers and valves are working. This procedure takes approximately one hour. There are no restrictions for this procedure.     Follow-Up: At [facility], you and your health needs are our priority.  As part of our continuing mission to provide you with exceptional heart care, we have created designated Provider Care Teams.  These Care Teams include your primary Cardiologist (physician) and Advanced Practice Providers (APPs -  Physician Assistants and Nurse Practitioners) who all work together to provide you with the care you need, when you need it.  We recommend signing up for the patient portal called "MyChart".  Sign up information is provided on this After Visit Summary.  MyChart is used to connect with patients for Virtual Visits (Telemedicine).  Patients are able to view lab/test results, encounter notes, upcoming appointments, etc.  Non-urgent messages can be sent to your provider as well.   To learn more about what you can do with MyChart, go to [email or web address].    Your next appointment:   12 month(s)  The format for your next appointment:   In Person  Provider:   Prentice Docker,  MD   Other Instructions None       Thank you for choosing [facility] !

## 2020-03-01 NOTE — Progress Notes (Signed)
SUBJECTIVE: The patient presents for past due follow-up.  Past medical history includes hypertensive heart disease and mitral regurgitation.  Nuclear stress test on 05/20/2018 was a low risk study, EF 63%.  There was a hypertensive response to exercise noted.  There were no arrhythmias.  Soft tissue attenuation was seen.  Event monitoring demonstrated sinus rhythm, sinus arrhythmia, and sinus tachycardia with isolated PACs.  Echocardiogram performed on 07/10/2017 demonstrated normal left ventricular systolic function and regional wall motion, LVEF 50 to 55%, mild LVH, grade 2 diastolic dysfunction, mild to moderate mitral regurgitation, and mild left atrial dilatation.  She denies chest pain.  She very seldom has palpitations or shortness of breath.  She has had some leg swelling and attributes this to weight gain, lack of physical activity, and varicose veins.  Her PCP started her on carvedilol due to persistently elevated blood pressures.  She does not think it has had much effect.  ECG performed in the office today which I ordered and personally interpreted demonstrates normal sinus rhythm with no ischemic ST segment or T-wave abnormalities, nor any arrhythmias.     Review of Systems: As per "subjective", otherwise negative.  Allergies  Allergen Reactions  . Amlodipine     Leg swelling  . Atenolol     bradycardia  . Catapres [Clonidine Hydrochloride]     Blood pressure dropped too low after 1st dose  . Clonidine Hcl   . Fosamax [Alendronate Sodium]     Jaw pain.  Aching joints.  . Lisinopril     REACTION: ? ACE cough in the past?  . Macrobid [Nitrofurantoin Monohydrate Macrocrystals]     Causes elevation of liver enzymes  . Nitrofurantoin     Other reaction(s): Liver Disorder  . Nitrofurantoin Monohyd Macro   . Pravastatin     myalgia  . Sulfa Antibiotics     Had joint aches after septra Rx.  Maybe related    Current Outpatient Medications  Medication Sig  Dispense Refill  . aspirin 81 MG tablet Take 81 mg by mouth daily.     . Calcium Carbonate (CALTRATE 600) 1500 MG TABS Take 1 tablet by mouth. Calcium 1200 mg every other day.    . carvedilol (COREG) 3.125 MG tablet Take 1 tablet (3.125 mg total) by mouth 2 (two) times daily with a meal. 180 tablet 3  . furosemide (LASIX) 20 MG tablet TAKE 1 TABLET BY MOUTH EVERY DAY 90 tablet 3  . levothyroxine (SYNTHROID) 125 MCG tablet Take 1 tablet (125 mcg total) by mouth daily before breakfast. 90 tablet 3  . LORazepam (ATIVAN) 1 MG tablet TAKE 1 TABLET BY MOUTH EVERYDAY AT BEDTIME 90 tablet 1  . losartan (COZAAR) 100 MG tablet Take 1 tablet (100 mg total) by mouth daily. 90 tablet 3  . Magnesium 400 MG CAPS Take 250 mg by mouth 3 (three) times daily.     . Multiple Vitamins-Minerals (ZINC PO) Take 10 mg by mouth.    . potassium chloride (KLOR-CON M10) 10 MEQ tablet Take 1.5 tablets (15 mEq total) by mouth daily. 135 tablet 3  . rosuvastatin (CRESTOR) 10 MG tablet Take 1 tablet (10 mg total) by mouth every other day. 45 tablet 3  . vitamin B-12 (CYANOCOBALAMIN) 1000 MCG tablet Place 2,000 mcg under the tongue daily.     . hydrALAZINE (APRESOLINE) 25 MG tablet Take 1 tablet (25 mg total) by mouth 3 (three) times daily. 270 tablet 3   No current facility-administered medications  for this visit.    Past Medical History:  Diagnosis Date  . History of kidney stones   . Hypertension   . Thyroid disease     Past Surgical History:  Procedure Laterality Date  . FOOT SURGERY     as a child/left foot  . GASTRIC BYPASS  04/2008  . GASTRIC BYPASS    . NASAL SEPTUM SURGERY  1988  . partial cornea transplant  2019  . TONSILLECTOMY     1963    Social History   Socioeconomic History  . Marital status: Married    Spouse name: Not on file  . Number of children: Not on file  . Years of education: Not on file  . Highest education level: Not on file  Occupational History  . Not on file  Tobacco Use  .  Smoking status: Never Smoker  . Smokeless tobacco: Never Used  Substance and Sexual Activity  . Alcohol use: No    Alcohol/week: 0.0 standard drinks  . Drug use: No  . Sexual activity: Not on file    Comment: married and monogamous  Other Topics Concern  . Not on file  Social History Narrative   ** Merged History Encounter **       Social Determinants of Health   Financial Resource Strain:   . Difficulty of Paying Living Expenses:   Food Insecurity:   . Worried About Charity fundraiser in the Last Year:   . Arboriculturist in the Last Year:   Transportation Needs:   . Film/video editor (Medical):   Marland Kitchen Lack of Transportation (Non-Medical):   Physical Activity:   . Days of Exercise per Week:   . Minutes of Exercise per Session:   Stress:   . Feeling of Stress :   Social Connections:   . Frequency of Communication with Friends and Family:   . Frequency of Social Gatherings with Friends and Family:   . Attends Religious Services:   . Active Member of Clubs or Organizations:   . Attends Archivist Meetings:   Marland Kitchen Marital Status:   Intimate Partner Violence:   . Fear of Current or Ex-Partner:   . Emotionally Abused:   Marland Kitchen Physically Abused:   . Sexually Abused:     Barbarann Ehlers, RN was present throughout the entirety of the encounter.  Vitals:   03/01/20 0845  BP: (!) 160/80  Pulse: 66  Temp: (!) 96 F (35.6 C)  SpO2: 99%  Height: 5\' 4"  (1.626 m)    Wt Readings from Last 3 Encounters:  10/06/19 199 lb 12.8 oz (90.6 kg)  10/10/18 202 lb 6.4 oz (91.8 kg)  07/10/18 203 lb (92.1 kg)     PHYSICAL EXAM General: NAD HEENT: Normal. Neck: No JVD, no thyromegaly. Lungs: Clear to auscultation bilaterally with normal respiratory effort. CV: Regular rate and rhythm, normal S1/S2, no S3/S4, no murmur.  Trace bilateral periankle edema with bilateral venous varicosities.  No carotid bruit.   Abdomen: Soft, nontender, no distention.  Neurologic: Alert and  oriented.  Psych: Normal affect. Skin: Normal. Musculoskeletal: No gross deformities.      Labs: Lab Results  Component Value Date/Time   K 4.0 10/06/2019 11:37 AM   BUN 18 10/06/2019 11:37 AM   CREATININE 0.68 10/06/2019 11:37 AM   CREATININE 0.57 04/06/2016 10:03 AM   ALT 12 10/06/2019 11:37 AM   TSH 2.000 10/06/2019 11:37 AM   HGB 14.0 10/06/2019 11:37 AM  Lipids: Lab Results  Component Value Date/Time   LDLCALC 107 (H) 10/06/2019 11:37 AM   LDLDIRECT 113 (H) 01/03/2007 08:19 PM   CHOL 210 (H) 10/06/2019 11:37 AM   TRIG 202 (H) 10/06/2019 11:37 AM   HDL 69 10/06/2019 11:37 AM       ASSESSMENT AND PLAN: 1. Hypertensive heart disease with grade 2 diastolic dysfunction/chronic diastolic heart failure: Blood pressure is markedly elevated.  She appears euvolemic on Lasix 20 mg daily.  She has multiple drug intolerances.  I will try hydralazine 25 mg 3 times daily.  2. Palpitations: Given history of left atrial dilatation, chronic diastolic heart failure with hypertensive heart disease, and mitral regurgitation, she is certainly at risk for atrial fibrillation.  That being said, event monitor was unremarkable as reviewed above.  No further testing is indicated at this time.  She is on carvedilol 3.125 mg twice daily.  3. Mitral regurgitation: Noted is mild to moderate in August 2018 as detailed above. I we will obtain a follow-up echocardiogram.  4. Hypertension: BP is markedly elevated.  She has multiple drug intolerances.  I will try hydralazine 25 mg 3 times daily.    Disposition: Follow up 1 year   Prentice Docker, M.D., F.A.C.C.

## 2020-03-01 NOTE — Progress Notes (Signed)
ekg 

## 2020-03-08 ENCOUNTER — Other Ambulatory Visit: Payer: Self-pay

## 2020-03-08 ENCOUNTER — Ambulatory Visit (HOSPITAL_COMMUNITY)
Admission: RE | Admit: 2020-03-08 | Discharge: 2020-03-08 | Disposition: A | Discharge status: Home / Self Care | Payer: Medicare Other | Source: Ambulatory Visit | Attending: Cardiovascular Disease | Admitting: Cardiovascular Disease

## 2020-03-08 DIAGNOSIS — I10 Essential (primary) hypertension: Secondary | ICD-10-CM | POA: Diagnosis not present

## 2020-03-08 NOTE — Progress Notes (Signed)
*  PRELIMINARY RESULTS* Echocardiogram 2D Echocardiogram has been performed.  Stacey Drain 03/08/2020, 2:48 PM

## 2020-03-26 ENCOUNTER — Other Ambulatory Visit: Payer: Self-pay | Admitting: *Deleted

## 2020-03-26 DIAGNOSIS — E7800 Pure hypercholesterolemia, unspecified: Secondary | ICD-10-CM

## 2020-03-29 MED ORDER — ROSUVASTATIN CALCIUM 10 MG PO TABS: 10.0000 mg | ORAL_TABLET | ORAL | 3 refills | Status: DC

## 2020-04-19 ENCOUNTER — Other Ambulatory Visit: Payer: Self-pay | Admitting: Family Medicine

## 2020-04-19 DIAGNOSIS — G4700 Insomnia, unspecified: Secondary | ICD-10-CM

## 2020-04-19 DIAGNOSIS — F419 Anxiety disorder, unspecified: Secondary | ICD-10-CM

## 2020-05-05 ENCOUNTER — Other Ambulatory Visit: Payer: Self-pay

## 2020-05-05 ENCOUNTER — Ambulatory Visit (INDEPENDENT_AMBULATORY_CARE_PROVIDER_SITE_OTHER): Payer: Medicare Other | Admitting: Family Medicine

## 2020-05-05 DIAGNOSIS — R011 Cardiac murmur, unspecified: Secondary | ICD-10-CM | POA: Diagnosis not present

## 2020-05-05 DIAGNOSIS — J069 Acute upper respiratory infection, unspecified: Secondary | ICD-10-CM | POA: Diagnosis present

## 2020-05-05 DIAGNOSIS — I340 Nonrheumatic mitral (valve) insufficiency: Secondary | ICD-10-CM | POA: Insufficient documentation

## 2020-05-05 NOTE — Patient Instructions (Signed)
You have a cold and it should start to get better about 7 - 10 days after it started.    You can take tylenol as needed for sore throat. Continue to keep well hydrated. Warm liquids with honey may help the sore throat and cough. Cold things like popsicles will help with the sore throat. Use a humidifier or warm shower to help with congestion.   For your cough, try honey   For your nasal congestion and runny nose, try a saline nasal spray   Some other therapies you can try are: push fluids, rest, gargle warm salt water, use vaporizer or mist prn and return office visit prn if symptoms persist or worsen.   Drinking warm liquids such as teas and soups can help with secretions and cough. A mist humidifier or vaporizer can work well to help with secretions and cough.  It is very important to clean the humidifier between use according to the instructions.    It was good to see you.  If you're still having trouble in the next week, come back and see [place].    Of course, if you start having trouble breathing, worsening fevers, vomiting and unable to hold down any fluids, or you have other concerns, don't hesitate to come back or go to the ED after hours.

## 2020-05-05 NOTE — Assessment & Plan Note (Signed)
Patient does have normal on physical exam.  Patient reports she has already been seen by cardiology for this.  Recent echo performed on 03/08/2020 showing mitral valve. Advised to continue to f/u with cardiology and PCP

## 2020-05-05 NOTE — Progress Notes (Signed)
    SUBJECTIVE:   CHIEF COMPLAINT / HPI:   Congestion  Patient presenting today for cough, congestion, runny nose.  Patient was vaccinated 2/7 and 2/25 with Pfizer vaccine. Had recent beach vacation with her son and his family in [place] last week. Last Thursday at beach at scratchy throat. On Saturday throat was sore and she took tylenol. Cannot take allergy medications 2/2 blood pressure. Patient now has a cough x2 days with mucous production. No fever, but did not check temperature. No chills. Runny nose x2 days with clear fluid. No loss of taste or smell. No chest congestion. No body aches. Some diarrhea yesterday but has a h/o gastric bypass x12 years and gets episodes of diarrhea chronically.   Patients grandkids are 9, 7, 5 y.o. They did not appear sick. No one else in family or household is sick. Grandkids are homeschooled but 5 y.o goes to daycare. Was wearing masks at the beach  PERTINENT  PMH / PSH: HTN, CHF, obesity, CHF  OBJECTIVE:   BP 118/62   Pulse 70   Wt 201 lb (91.2 kg)   SpO2 95%   BMI 34.50 kg/m   Gen: awake and alert, NAD HEENT: moist mucous membranes, oropharynx clear without tonsillar edema or exudate, TM visualized bilaterally Cardio: RRR, 2/6 murmur Resp: CTAB, no wheezes, rales or rhochi GI: soft, non tender, non distended, bowel sounds present Ext: no edema  ASSESSMENT/PLAN:   Viral URI Patient presenting with symptoms consistent with viral URI. Congestion/runny nose, cough, sore throat. No known sick contacts but grandson does attend daycare. Lung exam CTAB and patient is well appearing on exam. No signs of dehydration on exam. No signs of ear infection. Low concern for PNA. Conservative measures discussed including humidifier use, tylenol PRN, and honey. UTD on flu & COVID vaccine per chart review. Centor score -1 so unlikely strep throat. Strict return precautions given. Follow up in 1-2 weeks if no improvement.    Murmur Patient does have normal  on physical exam.  Patient reports she has already been seen by cardiology for this.  Recent echo performed on 03/08/2020 showing mitral valve. Advised to continue to f/u with cardiology and PCP     Oralia Manis, DO [facility]

## 2020-05-05 NOTE — Assessment & Plan Note (Signed)
Patient presenting with symptoms consistent with viral URI. Congestion/runny nose, cough, sore throat. No known sick contacts but grandson does attend daycare. Lung exam CTAB and patient is well appearing on exam. No signs of dehydration on exam. No signs of ear infection. Low concern for PNA. Conservative measures discussed including humidifier use, tylenol PRN, and honey. UTD on flu & COVID vaccine per chart review. Centor score -1 so unlikely strep throat. Strict return precautions given. Follow up in 1-2 weeks if no improvement.

## 2020-05-25 ENCOUNTER — Telehealth: Payer: Self-pay

## 2020-05-25 NOTE — Telephone Encounter (Signed)
Called and recommended flonase and afrin - afrin for a max of 3 days.

## 2020-05-25 NOTE — Telephone Encounter (Signed)
Patient calls nurse line reporting continued cold symptoms from 6/16 apt with Darin Engels. Patient complains of "fullness" in both ears, and sinus pain and pressure. Patient denies fever, chills, cough, or SOB. Patient has not been tested for covid, she has been vaccinated. Patient has an apt with PCP on 7/12. Patient is hopeful something can be called in for her in the meantime. Please advise.

## 2020-05-31 ENCOUNTER — Other Ambulatory Visit: Payer: Self-pay

## 2020-05-31 ENCOUNTER — Ambulatory Visit (INDEPENDENT_AMBULATORY_CARE_PROVIDER_SITE_OTHER): Payer: Medicare Other | Admitting: Family Medicine

## 2020-05-31 ENCOUNTER — Encounter: Payer: Self-pay | Admitting: Family Medicine

## 2020-05-31 ENCOUNTER — Other Ambulatory Visit: Payer: Self-pay | Admitting: Family Medicine

## 2020-05-31 DIAGNOSIS — H6980 Other specified disorders of Eustachian tube, unspecified ear: Secondary | ICD-10-CM

## 2020-05-31 DIAGNOSIS — E7800 Pure hypercholesterolemia, unspecified: Secondary | ICD-10-CM

## 2020-05-31 DIAGNOSIS — I872 Venous insufficiency (chronic) (peripheral): Secondary | ICD-10-CM

## 2020-05-31 DIAGNOSIS — M810 Age-related osteoporosis without current pathological fracture: Secondary | ICD-10-CM

## 2020-05-31 DIAGNOSIS — I10 Essential (primary) hypertension: Secondary | ICD-10-CM | POA: Diagnosis not present

## 2020-05-31 DIAGNOSIS — Z1231 Encounter for screening mammogram for malignant neoplasm of breast: Secondary | ICD-10-CM

## 2020-05-31 DIAGNOSIS — I5032 Chronic diastolic (congestive) heart failure: Secondary | ICD-10-CM | POA: Diagnosis not present

## 2020-05-31 DIAGNOSIS — I340 Nonrheumatic mitral (valve) insufficiency: Secondary | ICD-10-CM | POA: Diagnosis not present

## 2020-05-31 DIAGNOSIS — H6990 Unspecified Eustachian tube disorder, unspecified ear: Secondary | ICD-10-CM

## 2020-05-31 NOTE — Patient Instructions (Signed)
I will call with lab and bone density results.  Remind me of your strong family history of heart attack and stroke.  Maybe we will try the rosuvastatin daily. Your heart failure is actually better than 2018 - a good sign You do not need antibiotics at the dentist for your leaky heart valve.   Give the flonase more time to work for your plugged up ears.  Eustachian Tube dysfunction.  Give it several weeks to clear.   Yes, OK to stop the carvedilol.  Keep the pills in case we go back on.  Keep an eye on your BP.  The goal is less than 140/90.

## 2020-06-01 ENCOUNTER — Encounter: Payer: Self-pay | Admitting: Family Medicine

## 2020-06-01 DIAGNOSIS — H6980 Other specified disorders of Eustachian tube, unspecified ear: Secondary | ICD-10-CM | POA: Insufficient documentation

## 2020-06-01 LAB — BASIC METABOLIC PANEL
BUN/Creatinine Ratio: 27 (ref 12–28)
BUN: 15 mg/dL (ref 8–27)
CO2: 24 mmol/L (ref 20–29)
Calcium: 9.1 mg/dL (ref 8.7–10.3)
Chloride: 103 mmol/L (ref 96–106)
Creatinine, Ser: 0.55 mg/dL — ABNORMAL LOW (ref 0.57–1.00)
GFR calc Af Amer: 114 mL/min/{1.73_m2} (ref >59)
GFR calc non Af Amer: 99 mL/min/{1.73_m2} (ref >59)
Glucose: 87 mg/dL (ref 65–99)
Potassium: 3.7 mmol/L (ref 3.5–5.2)
Sodium: 142 mmol/L (ref 134–144)

## 2020-06-01 LAB — LIPID PANEL
Chol/HDL Ratio: 2.3 ratio (ref 0.0–4.4)
Cholesterol, Total: 156 mg/dL (ref 100–199)
HDL: 69 mg/dL (ref >39)
LDL Chol Calc (NIH): 68 mg/dL (ref 0–99)
Triglycerides: 106 mg/dL (ref 0–149)
VLDL Cholesterol Cal: 19 mg/dL (ref 5–40)

## 2020-06-01 NOTE — Assessment & Plan Note (Signed)
Well controled on current meds 

## 2020-06-01 NOTE — Assessment & Plan Note (Signed)
Check bone density. 

## 2020-06-01 NOTE — Progress Notes (Signed)
    SUBJECTIVE:   CHIEF COMPLAINT / HPI:   Several issues: 1. High cholesterol and strong fHx of CAD, CVD.  Tolerating qod rosuvastatin well.  Needs lipids. She is will to try daily if cholesterol indicates.  Primary prevention.  No personal Hx of CAD 2.HBP Started on hydralazine by cards to to elevated BP.  Doing well on that medication.  No complaints. 3. Sinus pressure and bilateral ear pain.  Based on phone call did 3 days of Afrin and is now regularly using flonase.  No fever.  No hx of chronic sinusitis. 4. Diastolic dysfunction and mitral regurg.  Seen on echo.  Diastolic dysfunction has downgraded from grade 2 to grade 1.  No CP or DOE.  Has chronic leg swelling mostly due to chronic venous insufficiency. 5. Asks about rechecking bone density.      OBJECTIVE:   BP 112/60   Pulse 79   Ht 5\' 4"  (1.626 m)   Wt 201 lb (91.2 kg)   SpO2 100%   BMI 34.50 kg/m   TMs slightly retracted no redness. Lungs clear Cardiac RRR without m or g Ext 2+ bilateral leg edema.  ASSESSMENT/PLAN:   Mitral regurgitation Asymptomatic, found on echo.  Does not meet criteria for SBE prophylaxis.  Chronic venous insufficiency Stable on current meds.  HYPERTENSION, BENIGN SYSTEMIC Well controled on current meds.  CHF (congestive heart failure), NYHA class I, chronic, diastolic (HCC) Stable on current meds.  Her peripheral edema is due to venous insufficiency.  Hypercholesteremia Doing well on current dose of rosuvastatin.  No change.  Osteoporosis Check bone density.  Eustachian tube dysfunction Continue with flonase.  ENT referral only if persists.     , MD [facility]

## 2020-06-01 NOTE — Assessment & Plan Note (Signed)
Stable on current meds.  Her peripheral edema is due to venous insufficiency.

## 2020-06-01 NOTE — Assessment & Plan Note (Signed)
Doing well on current dose of rosuvastatin.  No change.

## 2020-06-01 NOTE — Assessment & Plan Note (Signed)
Stable on current meds 

## 2020-06-01 NOTE — Assessment & Plan Note (Signed)
Continue with flonase.  ENT referral only if persists.

## 2020-06-01 NOTE — Assessment & Plan Note (Signed)
Asymptomatic, found on echo.  Does not meet criteria for SBE prophylaxis.

## 2020-06-21 ENCOUNTER — Encounter: Payer: Self-pay | Admitting: Family Medicine

## 2020-06-21 MED ORDER — MEBENDAZOLE 100 MG PO CHEW: 100.0000 mg | CHEWABLE_TABLET | Freq: Once | ORAL | 0 refills | Status: AC

## 2020-06-28 ENCOUNTER — Telehealth: Payer: Self-pay | Admitting: *Deleted

## 2020-06-28 NOTE — Telephone Encounter (Signed)
Received fax from pharmacy, PA needed on Emverm.  Clinical questions submitted via Cover My Meds.  Waiting on response, could take up to 72 hours.  Cover My Meds info: Key: [id number]  Jone Baseman, CMA

## 2020-06-29 ENCOUNTER — Encounter: Payer: Self-pay | Admitting: Family Medicine

## 2020-06-29 MED ORDER — ALBENDAZOLE 200 MG PO TABS: ORAL_TABLET | ORAL | 0 refills | Status: DC

## 2020-06-29 NOTE — Telephone Encounter (Signed)
LM with patient that a new Rx was sent.

## 2020-06-29 NOTE — Telephone Encounter (Signed)
Switch pinworm Rx from mebendazole to albendazole per insurance preference.

## 2020-06-29 NOTE — Telephone Encounter (Signed)
Pt states that she took something OTC and would like to discuss with Dr. Leveda Anna.  She would like a call back on her cell phone. Jone Baseman, CMA

## 2020-06-30 NOTE — Telephone Encounter (Signed)
Patient took OTC meds recommended by pharmacy.  No further action needed.  Documented in patient advice request.

## 2020-08-13 ENCOUNTER — Telehealth: Payer: Self-pay | Admitting: Family Medicine

## 2020-08-13 ENCOUNTER — Encounter: Payer: Self-pay | Admitting: Family Medicine

## 2020-08-13 NOTE — Telephone Encounter (Signed)
Called and answered her MyChart questions about timing of COVID booster.  She will get her booster in the next few weeks.

## 2020-08-25 ENCOUNTER — Ambulatory Visit (INDEPENDENT_AMBULATORY_CARE_PROVIDER_SITE_OTHER): Payer: Medicare Other

## 2020-08-25 ENCOUNTER — Other Ambulatory Visit: Payer: Self-pay

## 2020-08-25 DIAGNOSIS — Z23 Encounter for immunization: Secondary | ICD-10-CM | POA: Diagnosis present

## 2020-08-25 NOTE — Progress Notes (Signed)
° °  Covid-19 Vaccination Clinic  Name:  Jacqueline Hayes    MRN: [id number] DOB: 1956/05/07  08/25/2020  [name] was observed post Covid-19 immunization for 15 minutes without incident. She was provided with Vaccine Information Sheet and instruction to access the V-Safe system.   [name] was instructed to call 911 with any severe reactions post vaccine:  Difficulty breathing   Swelling of face and throat   A fast heartbeat   A bad rash all over body   Dizziness and weakness   Covid Booster administered LD without complication.

## 2020-09-08 ENCOUNTER — Ambulatory Visit
Admission: RE | Admit: 2020-09-08 | Discharge: 2020-09-08 | Disposition: A | Discharge status: Home / Self Care | Payer: Medicare Other | Source: Ambulatory Visit | Attending: Family Medicine | Admitting: Family Medicine

## 2020-09-08 ENCOUNTER — Ambulatory Visit: Payer: Medicare Other

## 2020-09-08 ENCOUNTER — Other Ambulatory Visit: Payer: Self-pay

## 2020-09-08 DIAGNOSIS — M810 Age-related osteoporosis without current pathological fracture: Secondary | ICD-10-CM

## 2020-09-09 ENCOUNTER — Encounter: Payer: Self-pay | Admitting: Family Medicine

## 2020-10-17 ENCOUNTER — Other Ambulatory Visit: Payer: Self-pay | Admitting: Family Medicine

## 2020-10-17 DIAGNOSIS — G4700 Insomnia, unspecified: Secondary | ICD-10-CM

## 2020-10-17 DIAGNOSIS — E039 Hypothyroidism, unspecified: Secondary | ICD-10-CM

## 2020-10-17 DIAGNOSIS — F419 Anxiety disorder, unspecified: Secondary | ICD-10-CM

## 2020-10-17 DIAGNOSIS — I10 Essential (primary) hypertension: Secondary | ICD-10-CM

## 2020-10-17 DIAGNOSIS — I5032 Chronic diastolic (congestive) heart failure: Secondary | ICD-10-CM

## 2020-10-21 ENCOUNTER — Other Ambulatory Visit: Payer: Self-pay

## 2020-10-21 ENCOUNTER — Ambulatory Visit
Admission: RE | Admit: 2020-10-21 | Discharge: 2020-10-21 | Disposition: A | Discharge status: Home / Self Care | Payer: Medicare Other | Source: Ambulatory Visit | Attending: Family Medicine | Admitting: Family Medicine

## 2020-10-21 DIAGNOSIS — Z1231 Encounter for screening mammogram for malignant neoplasm of breast: Secondary | ICD-10-CM

## 2021-01-15 ENCOUNTER — Other Ambulatory Visit: Payer: Self-pay | Admitting: Family Medicine

## 2021-01-15 DIAGNOSIS — I10 Essential (primary) hypertension: Secondary | ICD-10-CM

## 2021-01-26 ENCOUNTER — Encounter: Payer: Self-pay | Admitting: Family Medicine

## 2021-01-26 ENCOUNTER — Other Ambulatory Visit: Payer: Self-pay

## 2021-01-26 ENCOUNTER — Ambulatory Visit (INDEPENDENT_AMBULATORY_CARE_PROVIDER_SITE_OTHER): Payer: Medicare Other | Admitting: Family Medicine

## 2021-01-26 DIAGNOSIS — M542 Cervicalgia: Secondary | ICD-10-CM | POA: Insufficient documentation

## 2021-01-26 MED ORDER — VALACYCLOVIR HCL 1 G PO TABS: 1000.0000 mg | ORAL_TABLET | Freq: Three times a day (TID) | ORAL | 0 refills | Status: DC

## 2021-01-26 NOTE — Patient Instructions (Signed)
I am not sure what is causing the pain. My two leading thoughts are early shingles (before the rash) and neck arthritis with a pinched nerve. I am going to give you a written prescription for the shingles medicine to fill only if you break out in a rash.   If the pain continues for a month, that would not be shingles.  We might want to do some plain neck xrays if the pain continues. If the pain worsens, let me check you again.   OK to continue ibuprofen. I would hold off on your second shingles vaccine until the pain goes away or we figure it out.   The recommended second dose of shingrix is 2-6 months after the first dose.

## 2021-01-27 ENCOUNTER — Encounter: Payer: Self-pay | Admitting: Family Medicine

## 2021-01-27 NOTE — Assessment & Plan Note (Signed)
Etiology uncertain.  My leading possibilites are varicella zoster (shingles) before rash outbreak and cervical radiculopathy.   Observe.  Given Rx for acyclovir to take only if breaks out in rash.   Return worsening or failure to improve.

## 2021-01-27 NOTE — Progress Notes (Signed)
    SUBJECTIVE:   CHIEF COMPLAINT / HPI:   One week hx of left occipital pain.  Describes as an electric tingling from base of skull to top of head.  Left sided only.  No fever, rash, trauma or systemic symptoms.  Minimal neck discomfort.  No previous cervical xrays or diagnosis of neck arthritis. Just had first shingrix vaccine on 11/23/20.  Second due soon. No vertigo, hearing loss or change in balance.    OBJECTIVE:   BP 122/78   Pulse (!) 56   Ht 5\' 4"  (1.626 m)   Wt 206 lb 3.2 oz (93.5 kg)   SpO2 99%   BMI 35.39 kg/m   Confirmed pain distribution (left occipatal) No rash or redness.  TMs normal.  TMJ is fluid. Neck FROM without crepitis. Lungs clear  ASSESSMENT/PLAN:   Cervicalgia Etiology uncertain.  My leading possibilites are varicella zoster (shingles) before rash outbreak and cervical radiculopathy.   Observe.  Given Rx for acyclovir to take only if breaks out in rash.   Return worsening or failure to improve.     , MD [facility]

## 2021-02-03 ENCOUNTER — Other Ambulatory Visit: Payer: Medicare Other

## 2021-02-03 ENCOUNTER — Other Ambulatory Visit: Payer: Self-pay

## 2021-02-03 ENCOUNTER — Encounter: Payer: Self-pay | Admitting: Family Medicine

## 2021-02-03 DIAGNOSIS — M542 Cervicalgia: Secondary | ICD-10-CM

## 2021-02-03 NOTE — Assessment & Plan Note (Signed)
Since her pain has not resolved and she now has fatigue, I want a sed rate as I consider temporal arteritis.

## 2021-02-04 LAB — SEDIMENTATION RATE: Sed Rate: 4 mm/hr (ref 0–40)

## 2021-02-16 ENCOUNTER — Ambulatory Visit: Payer: Medicare Other | Admitting: Family Medicine

## 2021-02-17 ENCOUNTER — Other Ambulatory Visit: Payer: Self-pay | Admitting: *Deleted

## 2021-02-18 ENCOUNTER — Telehealth: Payer: Self-pay

## 2021-02-18 MED ORDER — HYDRALAZINE HCL 25 MG PO TABS: 25.0000 mg | ORAL_TABLET | Freq: Three times a day (TID) | ORAL | 0 refills | Status: DC

## 2021-02-18 MED ORDER — HYDRALAZINE HCL 25 MG PO TABS
25.0000 mg | ORAL_TABLET | Freq: Three times a day (TID) | ORAL | 0 refills | Status: DC
Start: 2021-02-18 — End: 2021-02-18

## 2021-02-18 NOTE — Telephone Encounter (Signed)
Refilled hydralazine 25 mg TID, #90, needs f/u apt patient in recalls

## 2021-02-28 ENCOUNTER — Encounter: Payer: Self-pay | Admitting: Family Medicine

## 2021-02-28 ENCOUNTER — Other Ambulatory Visit: Payer: Self-pay

## 2021-02-28 ENCOUNTER — Ambulatory Visit (INDEPENDENT_AMBULATORY_CARE_PROVIDER_SITE_OTHER): Payer: Medicare Other | Admitting: Family Medicine

## 2021-02-28 ENCOUNTER — Ambulatory Visit (INDEPENDENT_AMBULATORY_CARE_PROVIDER_SITE_OTHER): Payer: Medicare Other

## 2021-02-28 DIAGNOSIS — M542 Cervicalgia: Secondary | ICD-10-CM

## 2021-02-28 DIAGNOSIS — I10 Essential (primary) hypertension: Secondary | ICD-10-CM

## 2021-02-28 DIAGNOSIS — E7800 Pure hypercholesterolemia, unspecified: Secondary | ICD-10-CM | POA: Diagnosis not present

## 2021-02-28 DIAGNOSIS — Z23 Encounter for immunization: Secondary | ICD-10-CM | POA: Diagnosis present

## 2021-02-28 DIAGNOSIS — T466X5A Adverse effect of antihyperlipidemic and antiarteriosclerotic drugs, initial encounter: Secondary | ICD-10-CM

## 2021-02-28 DIAGNOSIS — E039 Hypothyroidism, unspecified: Secondary | ICD-10-CM | POA: Diagnosis not present

## 2021-02-28 DIAGNOSIS — M791 Myalgia, unspecified site: Secondary | ICD-10-CM

## 2021-02-28 NOTE — Patient Instructions (Addendum)
I am glad your head and neck pain is going away.  I hope it does not come back.  We will need to do more digging if it does come back.    I would wait at least 2-3 weeks before getting your shingles second shot since you are getting you COVID booster today.    I will call with the blood test results.  Great job on the weight loss.  I hope your trip to [place] and [place] this fall goes well.  Keep an eye out about COVID surges.    OK to skip the middle of day dose of hydralazine if your morning blood pressure is low.    See me in late September or early October.

## 2021-03-01 ENCOUNTER — Encounter: Payer: Self-pay | Admitting: Family Medicine

## 2021-03-01 LAB — BASIC METABOLIC PANEL
BUN/Creatinine Ratio: 26 (ref 12–28)
BUN: 17 mg/dL (ref 8–27)
CO2: 23 mmol/L (ref 20–29)
Calcium: 9.2 mg/dL (ref 8.7–10.3)
Chloride: 103 mmol/L (ref 96–106)
Creatinine, Ser: 0.66 mg/dL (ref 0.57–1.00)
Glucose: 103 mg/dL — ABNORMAL HIGH (ref 65–99)
Potassium: 3.8 mmol/L (ref 3.5–5.2)
Sodium: 143 mmol/L (ref 134–144)
eGFR: 97 mL/min/{1.73_m2} (ref >59)

## 2021-03-01 LAB — TSH: TSH: 2.12 u[IU]/mL (ref 0.450–4.500)

## 2021-03-01 NOTE — Assessment & Plan Note (Signed)
Tolerating every other day crestor

## 2021-03-01 NOTE — Assessment & Plan Note (Signed)
Improving.  Still believe most likely dx was musculoskeletal/cervical radiculopathy.

## 2021-03-01 NOTE — Assessment & Plan Note (Signed)
Tolerating every other day crestor 

## 2021-03-01 NOTE — Progress Notes (Signed)
    SUBJECTIVE:   CHIEF COMPLAINT / HPI:   FU cervicalgia  See my note of 01/26/21.  The pain in her occipital region is resolving nicely.  Never broke out in a rash.  Never filled valtrex  She is working to be more active.  Note that she has lost 3 lbs since last visit.    Hypertension is well controled.  In fact, she may be a bit over treated.  Has a history of low potassium.  Not checked recently  Brought in list of home BPs  Mostly good.  Some lows.  Rare highs.  On thyroid replacement hormone.  TSH not checked recently.   OBJECTIVE:   BP 114/76   Pulse 68   Ht 5\' 4"  (1.626 m)   Wt 203 lb 12.8 oz (92.4 kg)   SpO2 99%   BMI 34.98 kg/m   Neck and occiput normal Cardiac rRR without m or g Lungs clear.  ASSESSMENT/PLAN:   Cervicalgia Improving.  Still believe most likely dx was musculoskeletal/cervical radiculopathy.    HYPERTENSION, BENIGN SYSTEMIC No change in treatment.  Check BMP.  Results show good creat and potassium.  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  Hypothyroidism TSH normal on her long term stable dose of levothyroxine.     , MD [facility]

## 2021-03-01 NOTE — Assessment & Plan Note (Signed)
TSH normal on her long term stable dose of levothyroxine.

## 2021-03-01 NOTE — Assessment & Plan Note (Signed)
No change in treatment.  Check BMP.  Results show good creat and potassium.

## 2021-04-05 ENCOUNTER — Encounter: Payer: Self-pay | Admitting: Family Medicine

## 2021-04-05 DIAGNOSIS — M542 Cervicalgia: Secondary | ICD-10-CM

## 2021-04-07 ENCOUNTER — Ambulatory Visit
Admission: RE | Admit: 2021-04-07 | Discharge: 2021-04-07 | Disposition: A | Discharge status: Home / Self Care | Payer: Medicare Other | Source: Ambulatory Visit | Attending: Family Medicine | Admitting: Family Medicine

## 2021-04-07 ENCOUNTER — Other Ambulatory Visit: Payer: Self-pay

## 2021-04-07 DIAGNOSIS — M542 Cervicalgia: Secondary | ICD-10-CM

## 2021-05-09 ENCOUNTER — Other Ambulatory Visit: Payer: Self-pay | Admitting: Family Medicine

## 2021-05-09 DIAGNOSIS — G4700 Insomnia, unspecified: Secondary | ICD-10-CM

## 2021-05-09 DIAGNOSIS — F419 Anxiety disorder, unspecified: Secondary | ICD-10-CM

## 2021-05-23 ENCOUNTER — Telehealth: Payer: Self-pay | Admitting: Family Medicine

## 2021-05-23 NOTE — Telephone Encounter (Signed)
**  After Hours/ Emergency Line Call**  Received a call to report that Jacqueline Hayes tested positive for Covid19. She states she was told by her PCP to contact him if she tested positive for Covid19 for possible treatment. She started having symptoms Saturday with scratchy throat but tested negative for Covid19 yesterday. She tested positive today. No shortness of breath, has a pulse ox and has not noticed any drops.  Was unable to schedule in our CIDD clinic due to slots being blocked.  Discussed with patient that I will refer a message to the nursing staff to get her scheduled for an appointment tomorrow to discuss the possibilities of Paxlovid.  We will also forward a message to Dr. Leveda Anna, patient's PCP per her request.  Patient understands that nursing staff should reach out tomorrow to schedule her for a visit.  Red flags discussed.    [name], DO PGY-3, [facility] 05/23/2021 8:38 AM

## 2021-05-24 ENCOUNTER — Telehealth: Payer: Self-pay | Admitting: Family Medicine

## 2021-05-24 ENCOUNTER — Ambulatory Visit: Payer: Medicare Other

## 2021-05-24 DIAGNOSIS — U071 COVID-19: Secondary | ICD-10-CM

## 2021-05-24 MED ORDER — PAXLOVID 10 X 150 MG & 10 X 100MG PO TBPK: 2.0000 | ORAL_TABLET | Freq: Two times a day (BID) | ORAL | 0 refills | Status: AC

## 2021-05-24 NOTE — Telephone Encounter (Signed)
Patient is calling and would like for Dr. Leveda Anna to call her. She tested positive for Covid yesterday 05/23/21. She doesn't feel like she needs to be seen in clinic but would like to discuss a couple concerns with her health issues.   The best call back is [phone].

## 2021-05-24 NOTE — Addendum Note (Signed)
Addended by: Moses Manners on: 05/24/2021 10:41 AM   Modules accepted: Orders

## 2021-05-24 NOTE — Telephone Encounter (Signed)
Called patient.  Long conversation.   Yes, she and husband are both COVID Positive. She has only mild symptoms and is vaccinated x 4.  I discouraged paxlovid therapy.  She was persistent in wanting treatment.  I will put in referral.  She knows she will need to hold ASA, lorazepam and losartan if she receives paxolovid.  Based on her strong preference, I prescribed.   I also gave her the number for the state program for monoclonal antibody infusion for her husband.

## 2021-05-24 NOTE — Telephone Encounter (Signed)
Called patient per previous message from Dr. Atha Starks. Patient states that she would like to speak with Dr. Leveda Anna prior to scheduling in CIDD clinic.   Forwarding to PCP  Veronda Prude, RN

## 2021-05-24 NOTE — Telephone Encounter (Signed)
Received returned call from patient and pharmacy regarding clarification of rx. Pharmacist states that they are unable to see the dosing information on the rx. Faxed prescription with instructions to [facility].   Veronda Prude, RN

## 2021-05-24 NOTE — Telephone Encounter (Signed)
See note from today's phone message.

## 2021-05-27 ENCOUNTER — Encounter: Payer: Self-pay | Admitting: Family Medicine

## 2021-05-29 ENCOUNTER — Other Ambulatory Visit: Payer: Self-pay | Admitting: Family Medicine

## 2021-05-29 DIAGNOSIS — E7800 Pure hypercholesterolemia, unspecified: Secondary | ICD-10-CM

## 2021-07-01 ENCOUNTER — Telehealth: Payer: Self-pay

## 2021-07-01 NOTE — Telephone Encounter (Signed)
Pt scheduled for AWV -   Pt informed she is due for a follow up appt around Oct/Nov. Pt stated that she would call the office to schedule.   Sending note as an Financial planner

## 2021-07-01 NOTE — Telephone Encounter (Signed)
LVM to have pt call back to schedule AWV.   RE: confirm insurance and schedule AWV on my schedule if times are convenient for patient or other AWV schedule as template permits.   

## 2021-07-07 ENCOUNTER — Other Ambulatory Visit: Payer: Self-pay | Admitting: Family Medicine

## 2021-07-07 DIAGNOSIS — F419 Anxiety disorder, unspecified: Secondary | ICD-10-CM

## 2021-07-07 DIAGNOSIS — G4700 Insomnia, unspecified: Secondary | ICD-10-CM

## 2021-07-14 ENCOUNTER — Ambulatory Visit (INDEPENDENT_AMBULATORY_CARE_PROVIDER_SITE_OTHER): Payer: Medicare Other

## 2021-07-14 DIAGNOSIS — Z0000 Encounter for general adult medical examination without abnormal findings: Secondary | ICD-10-CM

## 2021-07-14 NOTE — Patient Instructions (Signed)
  Jacqueline Hayes , Thank you for taking time to come for your Medicare Wellness Visit. I appreciate your ongoing commitment to your health goals. Please review the following plan we discussed and let me know if I can assist you in the future.   These are the goals we discussed:  Goals       Set My Weight Loss Goal (pt-stated)      Follow Up Date 07/14/2022    Loss 10lb and stay active.  Patient will get more exercise and eat healthier. Patient stated that she will watch her salt intake and consumption of fatty foods. Patient stated she wanted to increase protein.    Why is this important?   Losing only 5 to 15 percent of your weight makes a big difference in your health.            This is a list of the screening recommended for you and due dates:  Health Maintenance  Topic Date Due   Tetanus Vaccine  05/30/2021   Flu Shot  06/20/2021   Mammogram  10/21/2022   Pneumonia vaccines (2 of 2 - PPSV23) 07/24/2024   Colon Cancer Screening  03/30/2025   DEXA scan (bone density measurement)  Completed   COVID-19 Vaccine  Completed   Hepatitis C Screening: USPSTF Recommendation to screen - Ages 71-79 yo.  Completed   Zoster (Shingles) Vaccine  Completed   HPV Vaccine  Aged Out

## 2021-07-14 NOTE — Progress Notes (Signed)
Subjective:   Jacqueline Hayes is a 65 y.o. female who presents for an Initial Medicare Annual Wellness Visit.  I connected with  Jacqueline Hayes on 07/14/21 by an audio only telemedicine application and verified that I am speaking with the correct person using two identifiers.   I discussed the limitations, risks, security and privacy concerns of performing an evaluation and management service by telephone and the availability of in person appointments. I also discussed with the patient that there may be a patient responsible charge related to this service. The patient expressed understanding and verbally consented to this telephonic visit.  Location of Patient: Home Location of Provider:   List any persons and their role that are participating in the visit with the patient: none  Review of Systems     Defer to PCP  Cardiac Risk Factors include: advanced age (>72men, >41 women);hypertension;dyslipidemia     Objective:    Today's Vitals   07/14/21 0919  PainSc: 4    There is no height or weight on file to calculate BMI.  Advanced Directives 07/14/2021 05/31/2020 05/05/2020 02/27/2018 11/22/2017 08/29/2017 07/19/2017  Does Patient Have a Medical Advance Directive? Yes No No No No No No  Type of Estate agent of [place];Living will - - - - - -  Copy of Healthcare Power of Attorney in Chart? No - copy requested - - - - - -  Would patient like information on creating a medical advance directive? - No - Patient declined No - Patient declined No - Patient declined No - Patient declined No - Patient declined No - Patient declined    Current Medications (verified) Outpatient Encounter Medications as of 07/14/2021  Medication Sig   aspirin 81 MG tablet Take 81 mg by mouth daily.    calcium carbonate (OSCAL) 1500 (600 Ca) MG TABS tablet Take 1 tablet by mouth. Calcium 1200 mg every other day.   EUTHYROX 125 MCG tablet TAKE 1 TABLET BY MOUTH ONCE DAILY BEFORE BREAKFAST    furosemide (LASIX) 20 MG tablet Take 1 tablet by mouth once daily   hydrALAZINE (APRESOLINE) 25 MG tablet TAKE 1 TABLET BY MOUTH THREE TIMES DAILY   LORazepam (ATIVAN) 1 MG tablet TAKE 1 TABLET BY MOUTH ONCE DAILY AT BEDTIME   losartan (COZAAR) 100 MG tablet Take 1 tablet by mouth once daily   Magnesium 400 MG CAPS Take 250 mg by mouth 3 (three) times daily.    Multiple Vitamins-Minerals (ZINC PO) Take 10 mg by mouth.   potassium chloride (KLOR-CON) 10 MEQ tablet TAKE 1 & 1/2 (ONE & ONE-HALF) TABLETS BY MOUTH ONCE DAILY   rosuvastatin (CRESTOR) 10 MG tablet TAKE 1 TABLET BY MOUTH EVERY OTHER DAY   vitamin B-12 (CYANOCOBALAMIN) 1000 MCG tablet Place 1,000 mcg under the tongue daily.   [DISCONTINUED] potassium chloride (KLOR-CON M10) 10 MEQ tablet Take 1.5 tablets (15 mEq total) by mouth daily.   No facility-administered encounter medications on file as of 07/14/2021.    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   TONSILLECTOMY     1963   Family History  Problem Relation Age of Onset   Hypertension Mother    Diabetes Mother    Heart disease Father    Hypertension Sister    Diabetes Sister    Stroke Sister    Stroke Sister    Diabetes Sister    Breast cancer Sister    Hypertension Son    Hypertension Son    Diabetes Son        Type 1 Diabetes   Breast  cancer Maternal Aunt    Social History   Socioeconomic History   Marital status: Married    Spouse name: Idania Desouza   Number of children: 2   Years of education: 14   Highest education level: Some college, no degree  Occupational History   Occupation: retired    Comment: Retired from [employer].  Tobacco Use   Smoking status: Never   Smokeless tobacco: Never  Vaping Use   Vaping Use: Never used  Substance and Sexual Activity   Alcohol use: No    Alcohol/week: 0.0 standard drinks   Drug use: Never   Sexual activity: Yes    Comment: married and monogamous  Other Topics Concern   Not on file  Social History Narrative   ** Merged History Encounter **       Patient lives with spouse Harvie Heck.    Social Determinants of Health   Financial Resource Strain: Low Risk    Difficulty of Paying Living Expenses: Not hard at all  Food Insecurity: No Food Insecurity   Worried About Programme researcher, broadcasting/film/video in the Last Year: Never true   Ran Out of Food in the Last Year: Never true  Transportation Needs: No Transportation Needs   Lack of Transportation (Medical): No   Lack of Transportation (Non-Medical): No  Physical Activity: Sufficiently Active   Days of Exercise per Week: 5 days   Minutes of Exercise per Session: 60 min  Stress: No Stress Concern Present   Feeling of Stress : Not at all  Social Connections: Socially Integrated   Frequency of Communication with Friends and Family: More than three times a week   Frequency of Social Gatherings with Friends and Family: More than three times a week   Attends Religious Services: More than 4 times per year   Active Member of [employer] or Organizations: Yes   Attends Engineer, structural: More than 4 times per year   Marital Status: Married    Tobacco Counseling Counseling given: No   Clinical Intake:  Pre-visit preparation completed: Yes  Pain : 0-10 Pain Score: 4  Pain Type: Acute pain Pain  Location: Back Pain Orientation: Right Pain Radiating Towards: Right hip pain radiates down the right leg. Pain Descriptors / Indicators: Burning Pain Onset: 1 to 4 weeks ago Pain Frequency: Intermittent Pain Relieving Factors: Tylenol and Ibuprofen rotated does help with the pain. Effect of Pain on Daily Activities: Patient states that it does not affect ADL's.  Pain Relieving Factors: Tylenol and Ibuprofen rotated does help with the pain.  Nutritional Risks: None Diabetes: No  How often do you need to have someone help you when you read instructions, pamphlets, or other written materials from your doctor or pharmacy?: 1 - Never What is the last grade level you completed  in school?: some college  Diabetic? No  Interpreter Needed?: No  Information entered by :: Radford Pax, CMA   Activities of Daily Living In your present state of health, do you have any difficulty performing the following activities: 07/14/2021  Hearing? N  Vision? N  Difficulty concentrating or making decisions? N  Walking or climbing stairs? N  Dressing or bathing? N  Doing errands, shopping? N  Preparing Food and eating ? N  Using the Toilet? N  In the past six months, have you accidently leaked urine? N  Do you have problems with loss of bowel control? N  Managing your Medications? N  Managing your Finances? N  Housekeeping or managing your Housekeeping? N  Some recent data might be hidden    Patient Care Team: Moses Manners, MD as PCP - General Laqueta Linden, MD (Inactive) as PCP - Cardiology (Cardiology) Daisy Lazar, DO (Optometry)  Indicate any recent Medical Services you may have received from other than [facility] providers in the past year (date may be approximate).     Assessment:   This is a routine wellness examination for [employer].  Hearing/Vision screen No results found.  Dietary issues and exercise activities discussed: Current Exercise Habits: Home exercise  routine, Type of exercise: walking;stretching, Time (Minutes): 60, Frequency (Times/Week): 5, Weekly Exercise (Minutes/Week): 300, Intensity: Mild, Exercise limited by: None identified   Goals Addressed               This Visit's Progress     Set My Weight Loss Goal (pt-stated)        Follow Up Date 07/14/2022    Loss 10lb and stay active.  Patient will get more exercise and eat healthier. Patient stated that she will watch her salt intake and consumption of fatty foods. Patient stated she wanted to increase protein.    Why is this important?   Losing only 5 to 15 percent of your weight makes a big difference in your health.          Depression Screen PHQ 2/9 Scores 07/14/2021 02/28/2021 01/26/2021 05/31/2020 05/05/2020 10/06/2019 10/10/2018  PHQ - 2 Score 0 0 0 0 0 0 0  PHQ- 9 Score 0 - 1 - - - -  Exception Documentation - - - - - - -    Fall Risk Fall Risk  07/14/2021 05/31/2020 05/05/2020 10/06/2019 10/10/2018  Falls in the past year? 0 0 0 0 0  Number falls in past yr: 0 - 0 0 -  Injury with Fall? 0 - 0 - -  Risk for fall due to : No Fall Risks - - - -  Follow up Falls evaluation completed - - Falls evaluation completed -    FALL RISK PREVENTION PERTAINING TO THE HOME:  Any stairs in or around the home? Yes  If so, are there any without handrails? Yes  Home free of loose throw rugs in walkways, pet beds, electrical cords, etc? No  Adequate lighting in your home to reduce risk of falls? Yes   ASSISTIVE DEVICES UTILIZED TO PREVENT FALLS:  Life alert? No  Use of a cane, walker or w/c? No  Grab bars in the bathroom? Yes  Shower chair or bench in shower? No  Elevated toilet seat or a handicapped toilet? No   TIMED UP AND GO:  Was the test performed?  N/A .  Length of time to ambulate 10 feet: N/A sec.   Per patient statement: Gait steady and fast without  use of assistive device  Cognitive Function:     6CIT Screen 07/14/2021  What Year? 0 points  What month? 0  points  What time? 0 points  Count back from 20 0 points  Months in reverse 0 points  Repeat phrase 0 points  Total Score 0    Immunizations Immunization History  Administered Date(s) Administered   Influenza,inj,Quad PF,6+ Mos 07/16/2019   Influenza-Unspecified 07/16/2019, 08/10/2020   PFIZER Comirnaty(Gray Top)Covid-19 Tri-Sucrose Vaccine 02/28/2021   PFIZER(Purple Top)SARS-COV-2 Vaccination 12/25/2019, 01/15/2020, 08/25/2020   Pneumococcal Conjugate-13 08/10/2020   Pneumococcal Polysaccharide-23 07/25/2019   Td 06/21/1999   Tdap 05/31/2011   Zoster Recombinat (Shingrix) 11/23/2020, 04/13/2021    TDAP status: Due, Education has been provided regarding the importance of this vaccine. Advised may receive this vaccine at local pharmacy or Health Dept. Aware to provide a copy of the vaccination record if obtained from local pharmacy or Health Dept. Verbalized acceptance and understanding.  Flu Vaccine status: Due, Education has been provided regarding the importance of this vaccine. Advised may receive this vaccine at local pharmacy or Health Dept. Aware to provide a copy of the vaccination record if obtained from local pharmacy or Health Dept. Verbalized acceptance and understanding.  Pneumococcal vaccine status: Completed during today's visit.  Covid-19 vaccine status: Completed vaccines  Qualifies for Shingles Vaccine? Yes   Zostavax completed No   Shingrix Completed?: Yes  Screening Tests Health Maintenance  Topic Date Due   TETANUS/TDAP  05/30/2021   INFLUENZA VACCINE  06/20/2021   MAMMOGRAM  10/21/2022   PNA vac Low Risk Adult (2 of 2 - PPSV23) 07/24/2024   COLONOSCOPY (Pts [id number] Insurance coverage will need to be confirmed)  03/30/2025   DEXA SCAN  Completed   COVID-19 Vaccine  Completed   Hepatitis C Screening  Completed   Zoster Vaccines- Shingrix  Completed   HPV VACCINES  Aged Out    Health Maintenance  Health Maintenance Due  Topic Date Due    TETANUS/TDAP  05/30/2021   INFLUENZA VACCINE  06/20/2021    Colorectal cancer screening: Type of screening: Colonoscopy. Completed 03/31/2015. Repeat every 10 years  Mammogram status: Completed 10/21/2020. Repeat every year  Bone Density status: Completed 09/08/2020. Results reflect: Bone density results: OSTEOPENIA. Repeat every 2 years.  Lung Cancer Screening: (Low Dose CT Chest recommended if Age 76-80 years, 30 pack-year currently smoking OR have quit w/in 15years.) does not qualify.   Lung Cancer Screening Referral: N/A  Additional Screening:  Hepatitis C Screening: does qualify; Completed 10/21/2015  Vision Screening: Recommended annual ophthalmology exams for early detection of glaucoma and other disorders of the eye. Is the patient up to date with their annual eye exam?  Yes  Who is the provider or what is the name of the office in which the patient attends annual eye exams? Dr. Daisy LazarMark Cotter with [facility]; Also goes [facility] Cle[facility]yshyn [facility] (eye surgeon). If pt is not established with a provider, would they like to be referred to a provider to establish care?  N/A .   Dental Screening: Recommended annual dental exams for proper oral hygiene  Community Resource Referral / Chronic Care Management: CRR required this visit?  No   CCM required this visit?  No      Plan:     I have personally reviewed and noted the following in the patient's chart:   Medical and social history Use of alcohol, tobacco or illicit drugs  Current medications and supplements including opioid prescriptions. Patient  is not currently taking opioid prescriptions. Functional ability and status Nutritional status Physical activity Advanced directives List of other physicians Hospitalizations, surgeries, and ER visits in previous 12 months Vitals Screenings to include cognitive, depression, and falls Referrals and appointments  In addition, I have reviewed and discussed with patient  certain preventive protocols, quality metrics, and best practice recommendations. A written personalized care plan for preventive services as well as general preventive health recommendations were provided to patient.     Dennis Bast, CMA   07/14/2021   Nurse Notes:   . Ms. Towry , Thank you for taking time to come for your Medicare Wellness Visit. I appreciate your ongoing commitment to your health goals. Please review the following plan we discussed and let me know if I can assist you in the future.   These are the goals we discussed:  Goals       Set My Weight Loss Goal (pt-stated)      Follow Up Date 07/14/2022    Loss 10lb and stay active.  Patient will get more exercise and eat healthier. Patient stated that she will watch her salt intake and consumption of fatty foods. Patient stated she wanted to increase protein.    Why is this important?   Losing only 5 to 15 percent of your weight makes a big difference in your health.            This is a list of the screening recommended for you and due dates:  Health Maintenance  Topic Date Due   Tetanus Vaccine  05/30/2021   Flu Shot  06/20/2021   Mammogram  10/21/2022   Pneumonia vaccines (2 of 2 - PPSV23) 07/24/2024   Colon Cancer Screening  03/30/2025   DEXA scan (bone density measurement)  Completed   COVID-19 Vaccine  Completed   Hepatitis C Screening: USPSTF Recommendation to screen - Ages 56-79 yo.  Completed   Zoster (Shingles) Vaccine  Completed   HPV Vaccine  Aged Out

## 2021-07-29 NOTE — Progress Notes (Signed)
Location of Patient: Home Location of Provider: Office

## 2021-08-23 ENCOUNTER — Other Ambulatory Visit: Payer: Self-pay | Admitting: Cardiology

## 2021-08-25 ENCOUNTER — Telehealth: Payer: Self-pay | Admitting: Cardiology

## 2021-08-25 MED ORDER — HYDRALAZINE HCL 25 MG PO TABS: 25.0000 mg | ORAL_TABLET | Freq: Three times a day (TID) | ORAL | 0 refills | Status: DC

## 2021-08-25 NOTE — Telephone Encounter (Signed)
    1. Which medications need to be refilled? (please list name of each medication and dose if known) hydralazine 25 mg   2. Which pharmacy/location (including street and city if local pharmacy) is medication to be sent to?[facility] [place]   3. Do they need a 30 day or 90 day supply? 90

## 2021-08-25 NOTE — Telephone Encounter (Signed)
Medication refill approved for 90 days. Pt has appt with Ival Bible on 09/07/2021.

## 2021-09-06 ENCOUNTER — Encounter: Payer: Self-pay | Admitting: Cardiology

## 2021-09-06 NOTE — Progress Notes (Signed)
Cardiology Office Note  Date: 09/07/2021   ID: Glendell, Schlottman June 18, 1956, MRN [id number]  PCP:  Moses Manners, MD  Cardiologist:  Nona Dell, MD Electrophysiologist:  None   Chief Complaint  Patient presents with   Cardiac follow-up     History of Present Illness: Jacqueline Hayes is a 65 y.o. female former patient of Dr. Purvis Sheffield now presenting to establish follow-up with me.  I reviewed her records and updated the chart.  She was last seen in April 2021.  She is here for a routine visit.  She does not report any sense of progressive palpitations, no dizziness or syncope.  Stable NYHA class II dyspnea with typical activities and no exertional chest pain.  She states that she had COVID-19 back in July, was not hospitalized and reports having fairly mild symptoms.  Her husband was also sick at that time.  I went over her medications which are noted below.  She states that her blood pressure has been well controlled on current regimen, tolerating things well.  She uses low-dose Lasix with potassium supplement for control of leg swelling, also has compression stockings.  Her last echocardiogram in April 2021 revealed LVEF 60 to 65% with mild diastolic dysfunction, also mild to moderate mitral regurgitation.  I personally reviewed her ECG today which shows sinus bradycardia.  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      calcium carbonate (OSCAL) 1500 (600 Ca) MG TABS tablet Take 1 tablet by mouth. Calcium 1200 mg every other day.     EUTHYROX 125 MCG tablet TAKE 1 TABLET BY MOUTH ONCE DAILY BEFORE BREAKFAST 90 tablet 3   furosemide (LASIX) 20 MG tablet Take 1 tablet by mouth once daily 90 tablet 3   hydrALAZINE (APRESOLINE) 25 MG tablet Take 1 tablet (25 mg total) by mouth 3 (three) times daily. 270 tablet 0   LORazepam (ATIVAN) 1 MG tablet TAKE 1 TABLET BY MOUTH ONCE DAILY AT BEDTIME 30 tablet 5   losartan (COZAAR) 100 MG tablet Take 1 tablet by mouth once daily 90 tablet 3   Magnesium 400 MG CAPS Take 250 mg by mouth 3 (three) times daily.      Multiple Vitamins-Minerals (ZINC PO) Take 10 mg by mouth.     potassium chloride (KLOR-CON) 10 MEQ tablet TAKE 1 & 1/2 (ONE & ONE-HALF) TABLETS BY MOUTH ONCE DAILY 135 tablet 3   rosuvastatin (CRESTOR) 10 MG tablet TAKE 1 TABLET BY MOUTH EVERY OTHER DAY 45 tablet 3   vitamin B-12 (CYANOCOBALAMIN) 1000 MCG tablet Place 1,000 mcg under the tongue daily.     No current facility-administered medications for this visit.   Allergies:  Amlodipine, Atenolol, Catapres [clonidine hydrochloride], Fosamax [alendronate sodium], Lisinopril, Macrobid [nitrofurantoin monohydrate macrocrystals], Nitrofurantoin, Nitrofurantoin monohyd macro, Pravastatin, and Sulfa antibiotics   ROS: No orthopnea  or PND  Physical Exam: VS:  BP 118/74   Pulse 60   Ht 5\' 3"  (1.6 m)   Wt 201 lb 12.8 oz (91.5 kg)   SpO2 99%   BMI 35.75 kg/m , BMI Body mass index is 35.75 kg/m.  Wt Readings from Last 3 Encounters:  09/07/21 201 lb 12.8 oz (91.5 kg)  02/28/21 203 lb 12.8 oz (92.4 kg)  01/26/21 206 lb 3.2 oz (93.5 kg)    General: Patient appears comfortable at rest. HEENT: Conjunctiva and lids normal, wearing a mask. Neck: Supple, no elevated JVP or carotid bruits, no thyromegaly. Lungs: Clear to auscultation, nonlabored breathing at rest. Cardiac: Regular rate and rhythm, no  S3, 1/6 systolic murmur. Extremities: Chronic appearing ankle edema.  ECG:  An ECG dated 03/01/2020 was personally reviewed today and demonstrated:  Sinus rhythm.  Recent Labwork: 02/28/2021: BUN 17; Creatinine, Ser 0.66; Potassium 3.8; Sodium 143; TSH 2.120     Component Value Date/Time   CHOL 156 05/31/2020 1046   TRIG 106 05/31/2020 1046   HDL 69 05/31/2020 1046   CHOLHDL 2.3 05/31/2020 1046   CHOLHDL 2.9 04/06/2016 1003   VLDL 23 04/06/2016 1003   LDLCALC 68 05/31/2020 1046   LDLDIRECT 113 (H) 01/03/2007 2019    Other Studies Reviewed Today:  Echocardiogram 03/08/2020:  1. Left ventricular ejection fraction, by estimation, is 60 to 65%. The  left ventricle has normal function. The left ventricle has no regional  wall motion abnormalities. Left ventricular diastolic parameters are  consistent with Grade I diastolic  dysfunction (impaired relaxation).   2. Right ventricular systolic function is normal. The right ventricular  size is normal. There is normal pulmonary artery systolic pressure.   3. The mitral valve is grossly normal. Mild to moderate mitral valve  regurgitation.   4. The aortic valve is tricuspid. Aortic valve regurgitation is not  visualized. No aortic stenosis is present.   5. The inferior vena cava is dilated in size with >50% respiratory  variability, suggesting right atrial pressure of 8 mmHg.   Assessment and Plan:  1.  Mitral regurgitation, mild to moderate by echocardiogram in April of last year and asymptomatic at this time.  Plan follow-up echocardiogram prior to her next visit in 1 year.  2.  History of PVCs, no progressive palpitations or syncope and LVEF normal.  Would continue observation for now.  3.  Essential hypertension, blood pressure well controlled today on combination of hydralazine and Cozaar.  She continues to follow with Dr. May.  4.  Mixed hyperlipidemia on Crestor, last LDL 68.  Medication Adjustments/Labs and Tests  Ordered: Current medicines are reviewed at length with the patient today.  Concerns regarding medicines are outlined above.   Tests Ordered: Orders Placed This Encounter  Procedures   EKG 12-Lead     Medication Changes: No orders of the defined types were placed in this encounter.   Disposition:  Follow up  1 year.  Signed, Leveda Anna, MD, [facility] 09/07/2021 1:16 PM    [facility] at [facility] [address] [place], [place] Phone: [phone]; Fax: [phone]

## 2021-09-07 ENCOUNTER — Other Ambulatory Visit: Payer: Self-pay

## 2021-09-07 ENCOUNTER — Encounter: Payer: Self-pay | Admitting: Cardiology

## 2021-09-07 ENCOUNTER — Ambulatory Visit (INDEPENDENT_AMBULATORY_CARE_PROVIDER_SITE_OTHER): Payer: Medicare Other | Admitting: Cardiology

## 2021-09-07 VITALS — BP 118/74 | HR 60 | Ht 63.0 in | Wt 201.8 lb

## 2021-09-07 DIAGNOSIS — E782 Mixed hyperlipidemia: Secondary | ICD-10-CM | POA: Diagnosis not present

## 2021-09-07 DIAGNOSIS — I340 Nonrheumatic mitral (valve) insufficiency: Secondary | ICD-10-CM | POA: Diagnosis not present

## 2021-09-07 DIAGNOSIS — I10 Essential (primary) hypertension: Secondary | ICD-10-CM | POA: Diagnosis not present

## 2021-09-07 DIAGNOSIS — I493 Ventricular premature depolarization: Secondary | ICD-10-CM

## 2021-09-07 NOTE — Patient Instructions (Signed)
Medication Instructions:  Your physician recommends that you continue on your current medications as directed. Please refer to the Current Medication list given to you today.  *If you need a refill on your cardiac medications before your next appointment, please call your pharmacy*   Lab Work: None If you have labs (blood work) drawn today and your tests are completely normal, you will receive your results only by: MyChart Message (if you have MyChart) OR A paper copy in the mail If you have any lab test that is abnormal or we need to change your treatment, we will call you to review the results.   Testing/Procedures: BEFORE YOUR NEXT VISIT: Your physician has requested that you have an echocardiogram. Echocardiography is a painless test that uses sound waves to create images of your heart. It provides your doctor with information about the size and shape of your heart and how well your heart's chambers and valves are working. This procedure takes approximately one hour. There are no restrictions for this procedure.    Follow-Up: At [facility], you and your health needs are our priority.  As part of our continuing mission to provide you with exceptional heart care, we have created designated Provider Care Teams.  These Care Teams include your primary Cardiologist (physician) and Advanced Practice Providers (APPs -  Physician Assistants and Nurse Practitioners) who all work together to provide you with the care you need, when you need it.  We recommend signing up for the patient portal called "MyChart".  Sign up information is provided on this After Visit Summary.  MyChart is used to connect with patients for Virtual Visits (Telemedicine).  Patients are able to view lab/test results, encounter notes, upcoming appointments, etc.  Non-urgent messages can be sent to your provider as well.   To learn more about what you can do with MyChart, go to [email or web address].    Your next  appointment:   1 year(s)  The format for your next appointment:   In Person  Provider:   Nona Dell, MD   Other Instructions

## 2021-09-28 ENCOUNTER — Other Ambulatory Visit: Payer: Self-pay | Admitting: Family Medicine

## 2021-09-28 DIAGNOSIS — Z1231 Encounter for screening mammogram for malignant neoplasm of breast: Secondary | ICD-10-CM

## 2021-09-29 ENCOUNTER — Other Ambulatory Visit: Payer: Self-pay | Admitting: Family Medicine

## 2021-09-29 DIAGNOSIS — I5032 Chronic diastolic (congestive) heart failure: Secondary | ICD-10-CM

## 2021-10-03 ENCOUNTER — Other Ambulatory Visit: Payer: Self-pay

## 2021-10-03 ENCOUNTER — Encounter: Payer: Self-pay | Admitting: Family Medicine

## 2021-10-03 ENCOUNTER — Ambulatory Visit (INDEPENDENT_AMBULATORY_CARE_PROVIDER_SITE_OTHER): Payer: Medicare Other | Admitting: Family Medicine

## 2021-10-03 VITALS — BP 121/65 | HR 52 | Ht 63.0 in | Wt 196.0 lb

## 2021-10-03 DIAGNOSIS — Z0000 Encounter for general adult medical examination without abnormal findings: Secondary | ICD-10-CM

## 2021-10-03 DIAGNOSIS — I10 Essential (primary) hypertension: Secondary | ICD-10-CM

## 2021-10-03 DIAGNOSIS — E039 Hypothyroidism, unspecified: Secondary | ICD-10-CM

## 2021-10-03 DIAGNOSIS — E7800 Pure hypercholesterolemia, unspecified: Secondary | ICD-10-CM | POA: Diagnosis not present

## 2021-10-03 DIAGNOSIS — S61219A Laceration without foreign body of unspecified finger without damage to nail, initial encounter: Secondary | ICD-10-CM | POA: Diagnosis not present

## 2021-10-03 DIAGNOSIS — Z23 Encounter for immunization: Secondary | ICD-10-CM

## 2021-10-03 NOTE — Patient Instructions (Signed)
Keep up the healthy living.  Stay blessed. I will call with lab test results

## 2021-10-03 NOTE — Assessment & Plan Note (Signed)
Will give Tdap booster.

## 2021-10-04 ENCOUNTER — Encounter: Payer: Self-pay | Admitting: Family Medicine

## 2021-10-04 LAB — LIPID PANEL
Chol/HDL Ratio: 2.4 ratio (ref 0.0–4.4)
Cholesterol, Total: 171 mg/dL (ref 100–199)
HDL: 71 mg/dL (ref >39)
LDL Chol Calc (NIH): 77 mg/dL (ref 0–99)
Triglycerides: 134 mg/dL (ref 0–149)
VLDL Cholesterol Cal: 23 mg/dL (ref 5–40)

## 2021-10-04 LAB — CMP14+EGFR
ALT: 15 IU/L (ref 0–32)
AST: 20 IU/L (ref 0–40)
Albumin/Globulin Ratio: 2.3 — ABNORMAL HIGH (ref 1.2–2.2)
Albumin: 4.5 g/dL (ref 3.8–4.8)
Alkaline Phosphatase: 85 IU/L (ref 44–121)
BUN/Creatinine Ratio: 28 (ref 12–28)
BUN: 19 mg/dL (ref 8–27)
Bilirubin Total: 0.4 mg/dL (ref 0.0–1.2)
CO2: 26 mmol/L (ref 20–29)
Calcium: 9.5 mg/dL (ref 8.7–10.3)
Chloride: 101 mmol/L (ref 96–106)
Creatinine, Ser: 0.68 mg/dL (ref 0.57–1.00)
Globulin, Total: 2 g/dL (ref 1.5–4.5)
Glucose: 93 mg/dL (ref 70–99)
Potassium: 5.1 mmol/L (ref 3.5–5.2)
Sodium: 142 mmol/L (ref 134–144)
Total Protein: 6.5 g/dL (ref 6.0–8.5)
eGFR: 96 mL/min/{1.73_m2} (ref >59)

## 2021-10-04 LAB — CBC
Hematocrit: 39.2 % (ref 34.0–46.6)
Hemoglobin: 12.7 g/dL (ref 11.1–15.9)
MCH: 29.6 pg (ref 26.6–33.0)
MCHC: 32.4 g/dL (ref 31.5–35.7)
MCV: 91 fL (ref 79–97)
Platelets: 201 10*3/uL (ref 150–450)
RBC: 4.29 x10E6/uL (ref 3.77–5.28)
RDW: 12.3 % (ref 11.7–15.4)
WBC: 3.8 10*3/uL (ref 3.4–10.8)

## 2021-10-04 LAB — TSH: TSH: 3.37 u[IU]/mL (ref 0.450–4.500)

## 2021-10-04 NOTE — Assessment & Plan Note (Signed)
Good response to statin, which she is tolerating well

## 2021-10-04 NOTE — Assessment & Plan Note (Signed)
TSH done and normal.  Continue current meds

## 2021-10-04 NOTE — Progress Notes (Addendum)
    SUBJECTIVE:   CHIEF COMPLAINT / HPI:   Multiple issues. Acute problems: none. Chronic problems.   HBP, no complaints on current meds. Home BPs fine.  No CP, SOB or lightheadedness Hypercholesterolemia. Primary prevention.  Tolerating rosuvastatin well.  Had myalgias on previous statin.  Strong FHx of ASCVD risk. Hypothyroid.  Stable for years on current dose of replacement.  No hot or cold intolerance. MR.  Followed by cards.  They plan repeat echo next year.  Asymptomatic.   HPDP: Nicely up to date.  Needs tetanus.  Has wound: minor laceration of finger of left hand.   Habits.  Eating quite healthy.  Very active.  Weight is good for her (S/P bariatric surgery remotely.)  PERTINENT  PMH / PSH: Denies CP, DOE, cough, bleeding, Abd pain, weakness or numbness.  Denies change in bowel, bladder or appetite.  OBJECTIVE:   BP 121/65   Pulse (!) 52   Ht 5\' 3"  (1.6 m)   Wt 196 lb (88.9 kg)   SpO2 100%   BMI 34.72 kg/m   HEENT WNL Neck supple without thyromegally Lungs clear Cardiac RRR without m or g (I cannot hear her MR.) Abd benign Ext chronic stable leg swelling. Minor healing laceration of left 4th finger. Neuro, motor, sensory, gait, cognition and affect all grossly normal.  ASSESSMENT/PLAN:   Laceration of finger of left hand Will give Tdap booster.   Routine general medical examination at a health care facility Healthy female with no at risk behaviors who is up to date on screening and immunizations.  Hypothyroidism TSH done and normal.  Continue current meds  Hypercholesteremia Good response to statin, which she is tolerating well  HYPERTENSION, BENIGN SYSTEMIC Good control on current meds.     , MD [facility]

## 2021-10-04 NOTE — Assessment & Plan Note (Signed)
Good control on current meds. 

## 2021-10-04 NOTE — Assessment & Plan Note (Signed)
Healthy female with no at risk behaviors who is up to date on screening and immunizations.

## 2021-10-30 ENCOUNTER — Encounter: Payer: Self-pay | Admitting: Family Medicine

## 2021-10-31 ENCOUNTER — Encounter: Payer: Self-pay | Admitting: Family Medicine

## 2021-11-02 ENCOUNTER — Ambulatory Visit
Admission: RE | Admit: 2021-11-02 | Discharge: 2021-11-02 | Disposition: A | Discharge status: Home / Self Care | Payer: Medicare Other | Source: Ambulatory Visit | Attending: Family Medicine | Admitting: Family Medicine

## 2021-11-02 DIAGNOSIS — Z1231 Encounter for screening mammogram for malignant neoplasm of breast: Secondary | ICD-10-CM

## 2021-11-03 NOTE — Addendum Note (Signed)
Addended by: Moses Manners on: 11/03/2021 10:09 AM   Modules accepted: Level of Service

## 2021-11-21 ENCOUNTER — Other Ambulatory Visit: Payer: Self-pay | Admitting: Family Medicine

## 2021-11-21 DIAGNOSIS — E039 Hypothyroidism, unspecified: Secondary | ICD-10-CM

## 2021-11-29 ENCOUNTER — Encounter: Payer: Self-pay | Admitting: Family Medicine

## 2021-12-28 ENCOUNTER — Other Ambulatory Visit: Payer: Self-pay | Admitting: Family Medicine

## 2021-12-28 DIAGNOSIS — I10 Essential (primary) hypertension: Secondary | ICD-10-CM

## 2021-12-28 DIAGNOSIS — I5032 Chronic diastolic (congestive) heart failure: Secondary | ICD-10-CM

## 2022-02-13 ENCOUNTER — Other Ambulatory Visit: Payer: Self-pay | Admitting: Family Medicine

## 2022-02-13 DIAGNOSIS — F419 Anxiety disorder, unspecified: Secondary | ICD-10-CM

## 2022-02-13 DIAGNOSIS — G4700 Insomnia, unspecified: Secondary | ICD-10-CM

## 2022-02-14 ENCOUNTER — Other Ambulatory Visit: Payer: Self-pay | Admitting: Family Medicine

## 2022-02-14 DIAGNOSIS — G4700 Insomnia, unspecified: Secondary | ICD-10-CM

## 2022-02-14 DIAGNOSIS — F419 Anxiety disorder, unspecified: Secondary | ICD-10-CM

## 2022-03-14 ENCOUNTER — Other Ambulatory Visit: Payer: Self-pay | Admitting: Family Medicine

## 2022-03-14 DIAGNOSIS — F419 Anxiety disorder, unspecified: Secondary | ICD-10-CM

## 2022-03-14 DIAGNOSIS — G4700 Insomnia, unspecified: Secondary | ICD-10-CM

## 2022-04-21 ENCOUNTER — Ambulatory Visit: Payer: Medicare Other | Admitting: Family Medicine

## 2022-04-25 ENCOUNTER — Encounter: Payer: Self-pay | Admitting: *Deleted

## 2022-05-16 IMAGING — MG MM DIGITAL SCREENING BILAT W/ TOMO AND CAD
8 series · 8 of 24 positions shown · non-contrast
Comparison: Previous exam(s).

CLINICAL DATA: Screening.

EXAM:
DIGITAL SCREENING BILATERAL MAMMOGRAM WITH TOMOSYNTHESIS AND CAD
TECHNIQUE: Bilateral screening digital craniocaudal and mediolateral oblique
mammograms were obtained. Bilateral screening digital breast
tomosynthesis was performed. The images were evaluated with
computer-aided detection.

[R MLO synth-2D]
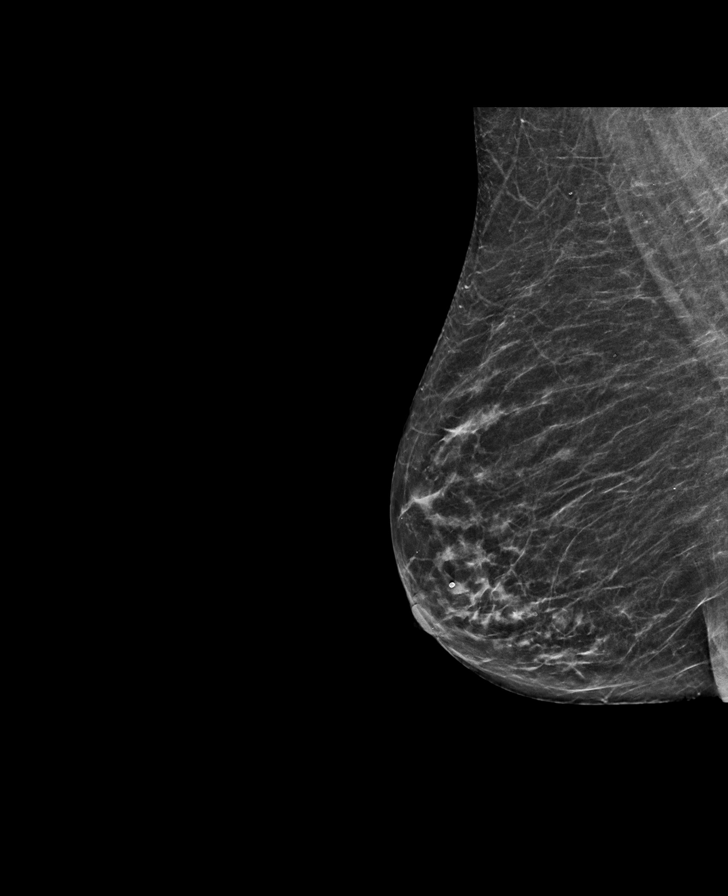

[L MLO synth-2D]
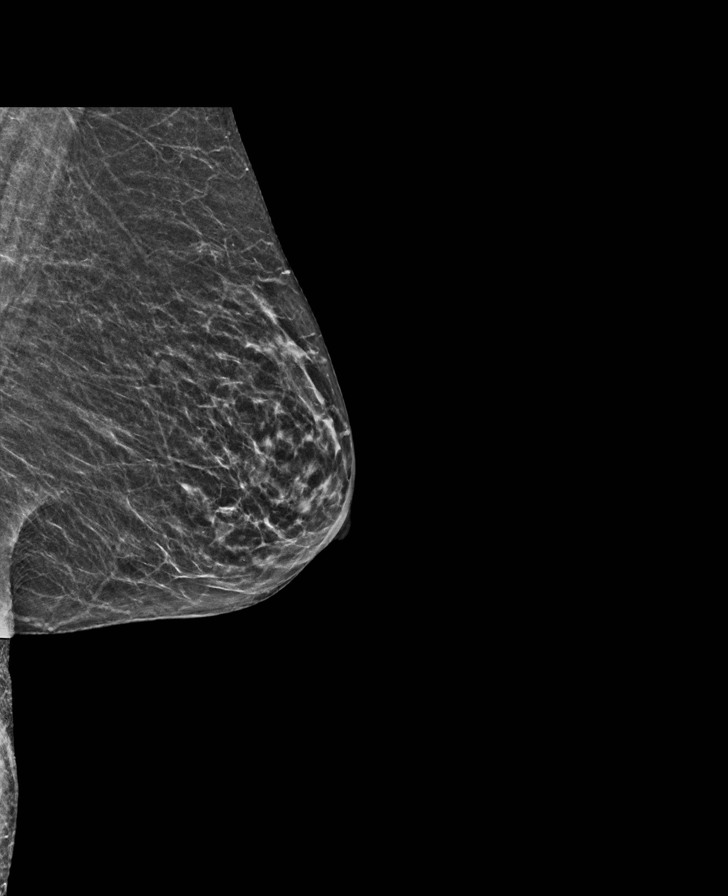

[R CC synth-2D]
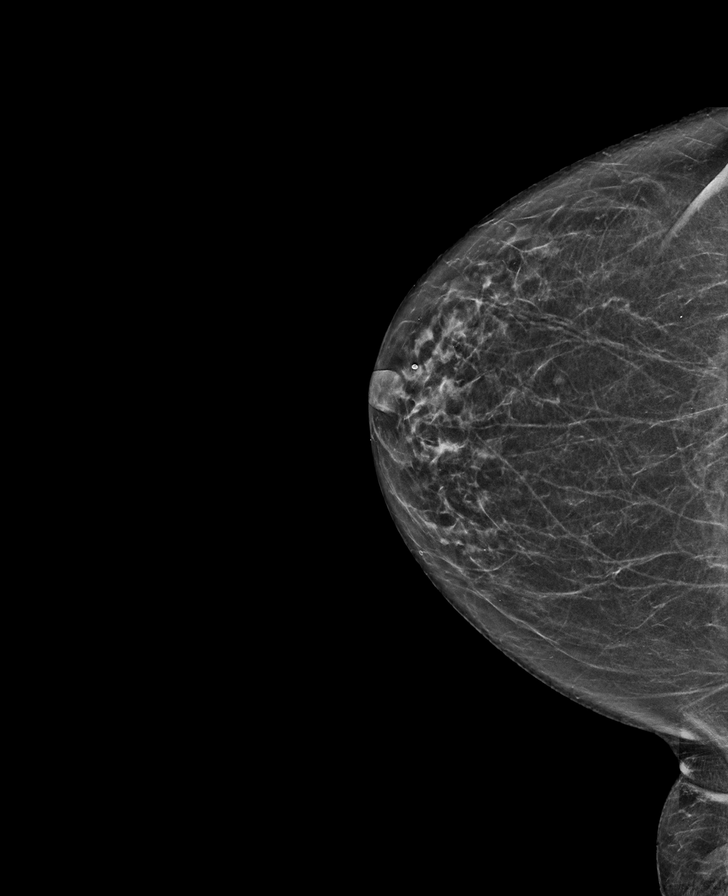

[L CC synth-2D]
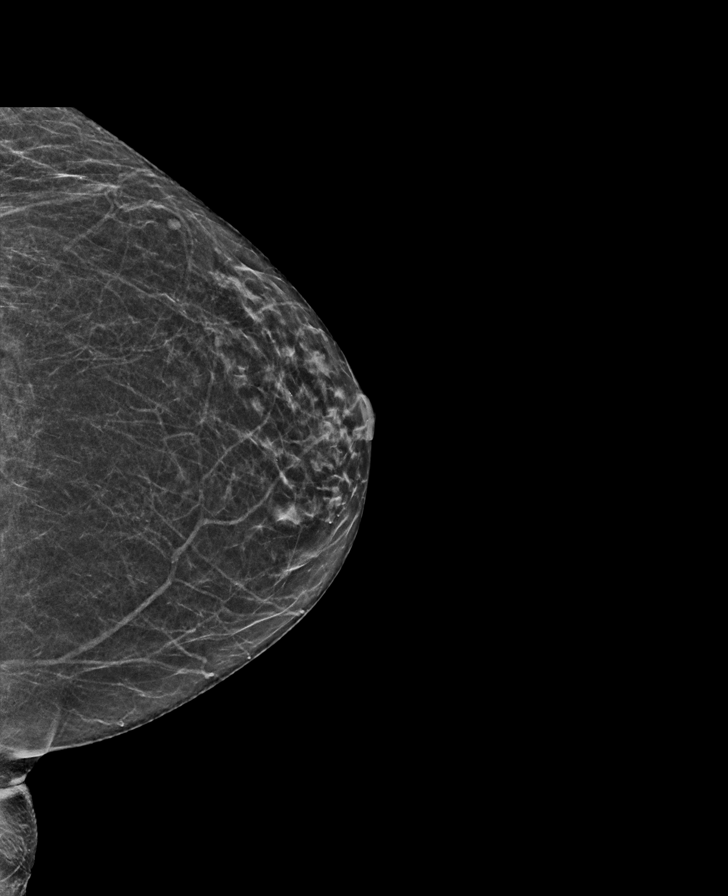

[R CC tomo · tomo slice 28/55.0]
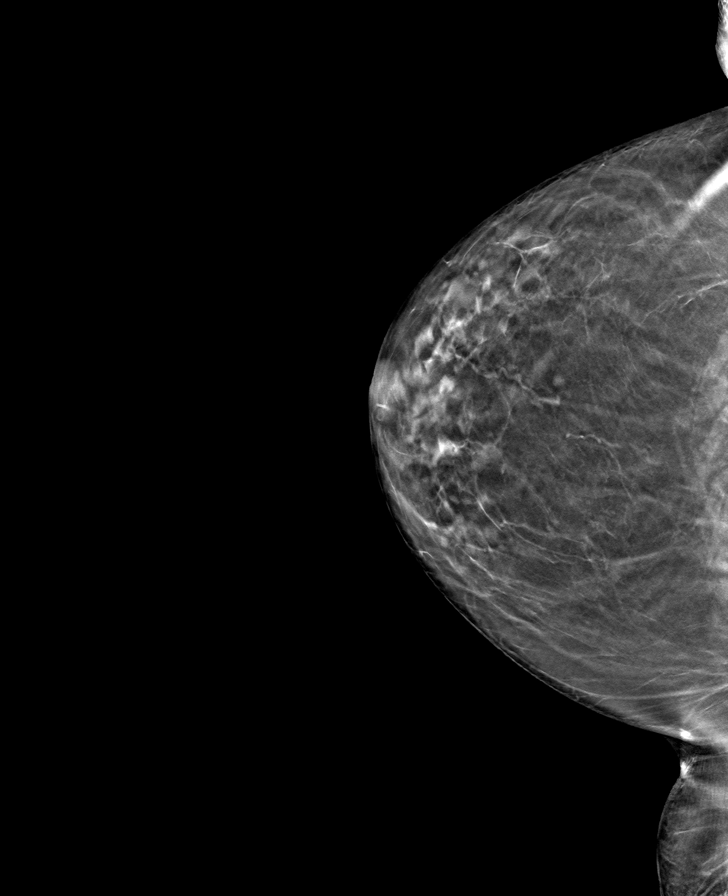

[R MLO tomo · tomo slice 28/55.0]
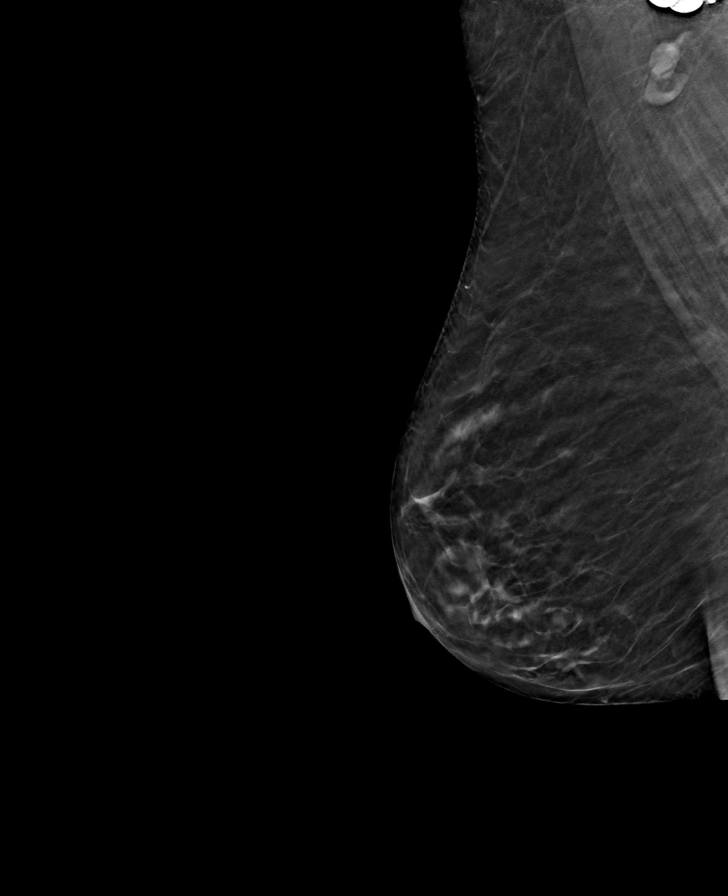

[L CC tomo · tomo slice 25/49.0]
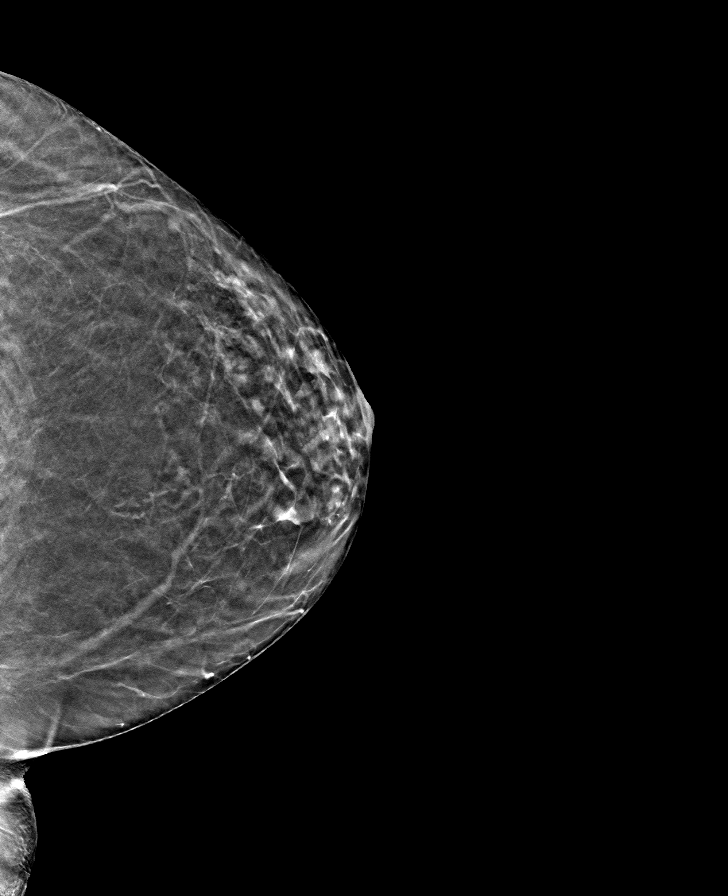

[L MLO tomo · tomo slice 26/51.0]
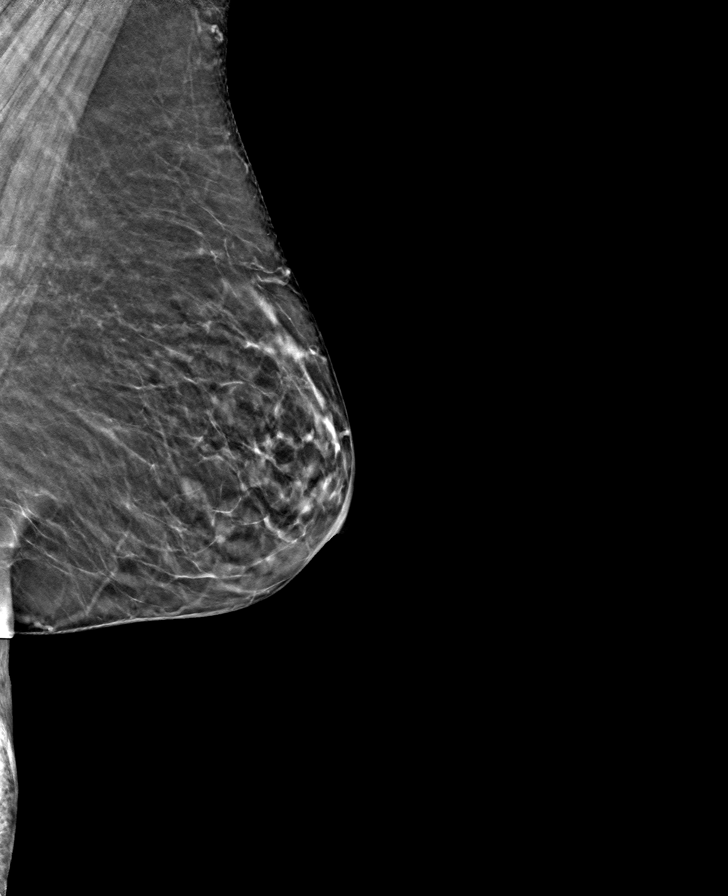

[8 of 24 positions shown; findings below may reference images not displayed]

ACR Breast Density Category b: There are scattered areas of
fibroglandular density.
FINDINGS: There are no findings suspicious for malignancy.
IMPRESSION: No mammographic evidence of malignancy. A result letter of this
screening mammogram will be mailed directly to the patient.

RECOMMENDATION:
Screening mammogram in one year. (Code:51-O-LD2)

BI-RADS CATEGORY  1: Negative.

## 2022-06-19 ENCOUNTER — Other Ambulatory Visit: Payer: Self-pay | Admitting: Family Medicine

## 2022-06-19 DIAGNOSIS — E7800 Pure hypercholesterolemia, unspecified: Secondary | ICD-10-CM

## 2022-07-17 ENCOUNTER — Ambulatory Visit: Payer: Medicare Other | Admitting: Family Medicine

## 2022-07-20 ENCOUNTER — Encounter: Payer: Self-pay | Admitting: Family Medicine

## 2022-07-20 ENCOUNTER — Ambulatory Visit (INDEPENDENT_AMBULATORY_CARE_PROVIDER_SITE_OTHER): Payer: Medicare Other | Admitting: Family Medicine

## 2022-07-20 VITALS — BP 120/70 | HR 64 | Temp 97.7°F | Ht 63.0 in | Wt 202.2 lb

## 2022-07-20 DIAGNOSIS — E785 Hyperlipidemia, unspecified: Secondary | ICD-10-CM

## 2022-07-20 DIAGNOSIS — I5032 Chronic diastolic (congestive) heart failure: Secondary | ICD-10-CM

## 2022-07-20 DIAGNOSIS — E039 Hypothyroidism, unspecified: Secondary | ICD-10-CM | POA: Diagnosis not present

## 2022-07-20 DIAGNOSIS — I10 Essential (primary) hypertension: Secondary | ICD-10-CM

## 2022-07-20 DIAGNOSIS — G4700 Insomnia, unspecified: Secondary | ICD-10-CM

## 2022-07-20 DIAGNOSIS — H18519 Endothelial corneal dystrophy, unspecified eye: Secondary | ICD-10-CM

## 2022-07-20 NOTE — Assessment & Plan Note (Signed)
Stable on Crestor 10 mg every other day.  We can like lipids next office visit.  He really to recheck today.

## 2022-07-20 NOTE — Assessment & Plan Note (Signed)
She uses lorazepam 1 mg nightly as needed.  She is trying to wean off on this and is sometimes taking a half a tablet at night.  We will continue for now though she will continue to try to wean off.

## 2022-07-20 NOTE — Assessment & Plan Note (Signed)
She is on Synthroid 125 mcg daily.  Last TSH was at goal.  We will continue current dose.  Recheck TSH next office visit.

## 2022-07-20 NOTE — Assessment & Plan Note (Signed)
Continue management per ophthalmology. 

## 2022-07-20 NOTE — Patient Instructions (Signed)
It was very nice to see you today!  Keep up the great work.  No medication changes today.  I will see back in 6 to 12 months for your regular follow-up visit with labs.  Please come back to see [place] sooner if needed.  Take care, Dr Jimmey Ralph  PLEASE NOTE:  If you had any lab tests please let us know if you have not heard back within a few days. You may see your results on mychart before we have a chance to review them but we will give you a call once they are reviewed by [place]. If we ordered any referrals today, please let us know if you have not heard from their office within the next week.   Please try these tips to maintain a healthy lifestyle:  Eat at least 3 REAL meals and 1-2 snacks per day.  Aim for no more than 5 hours between eating.  If you eat breakfast, please do so within one hour of getting up.   Each meal should contain half fruits/vegetables, one quarter protein, and one quarter carbs (no bigger than a computer mouse)  Cut down on sweet beverages. This includes juice, soda, and sweet tea.   Drink at least 1 glass of water with each meal and aim for at least 8 glasses per day  Exercise at least 150 minutes every week.

## 2022-07-20 NOTE — Assessment & Plan Note (Signed)
Stable on current regimen hydralazine 25 mg 3 times daily, losartan 100 mg daily.

## 2022-07-20 NOTE — Assessment & Plan Note (Signed)
No signs of volume overload.  Symptoms are well controlled.  We will continue current regimen per cardiology.

## 2022-07-20 NOTE — Progress Notes (Signed)
Jacqueline Hayes is a 66 y.o. female who presents today for an office visit.  She is establishing care.   Assessment/Plan:  Chronic Problems Addressed Today: Essential hypertension Stable on current regimen hydralazine 25 mg 3 times daily, losartan 100 mg daily.  Dyslipidemia Stable on Crestor 10 mg every other day.  We can like lipids next office visit.  He really to recheck today.  Hypothyroidism She is on Synthroid 125 mcg daily.  Last TSH was at goal.  We will continue current dose.  Recheck TSH next office visit.  Insomnia She uses lorazepam 1 mg nightly as needed.  She is trying to wean off on this and is sometimes taking a half a tablet at night.  We will continue for now though she will continue to try to wean off.  CHF (congestive heart failure), NYHA class I, chronic, diastolic (HCC) No signs of volume overload.  Symptoms are well controlled.  We will continue current regimen per cardiology.  Fuchs' corneal dystrophy Continue management per ophthalmology.   Preventative health care Discussed vaccines.  Recommended she get RCV vaccine later this year.  She will be getting flu vaccine and COVID booster later this year as well.  She is up-to-date on cancer screening.     Subjective:  HPI:  See A/p for status of chronic conditions. She has no acute concerns today.   ROS: Per HPI, otherwise a complete review of systems was negative.   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 04/02/2019   CHF (congestive heart failure), NYHA class I,  chronic, diastolic ([place]) 22/29/7989   Pain in joint, lower leg 04/06/2016   Osteoporosis 04/06/2016   Chronic venous insufficiency 04/06/2016   H/O bariatric surgery 03/25/2012   Hearing loss 11/23/2011   Fuchs' corneal dystrophy 05/31/2011   Hypothyroidism 01/17/2007   Dyslipidemia 01/17/2007   Essential hypertension 01/17/2007   Past Surgical History:  Procedure Laterality Date   CATARACT EXTRACTION, BILATERAL Bilateral    FOOT SURGERY Left    Childhood   GASTRIC BYPASS  04/2008   NASAL SEPTUM SURGERY  1988   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      BINAXNOW COVID-19 AG HOME TEST KIT Use as Directed on the Package     calcium carbonate (OSCAL)  1500 (600 Ca) MG TABS tablet Take 1 tablet by mouth. Calcium 1200 mg every other day.     fluticasone (FLONASE) 50 MCG/ACT nasal spray Place into the nose.     furosemide (LASIX) 20 MG tablet Take 1 tablet by mouth once daily 90 tablet 3   hydrALAZINE (APRESOLINE) 25 MG tablet Take 1 tablet (25 mg total) by mouth 3 (three) times daily. 270 tablet 0   levothyroxine (SYNTHROID) 125 MCG tablet TAKE 1 TABLET BY MOUTH ONCE DAILY BEFORE BREAKFAST 90 tablet 3   LORazepam (ATIVAN) 1 MG tablet TAKE 1 TABLET BY MOUTH ONCE DAILY AT BEDTIME 90 tablet 1   losartan (COZAAR) 100 MG tablet Take 1 tablet by mouth once daily 90 tablet 3   Magnesium 400 MG CAPS Take 250 mg by mouth  3 (three) times daily.      Multiple Vitamins-Minerals (ZINC PO) Take 10 mg by mouth.     potassium chloride (KLOR-CON) 10 MEQ tablet TAKE 1 & 1/2 (ONE & ONE-HALF) TABLETS BY MOUTH ONCE DAILY 135 tablet 3   rosuvastatin (CRESTOR) 10 MG tablet TAKE 1 TABLET BY MOUTH EVERY OTHER DAY 45 tablet 3   vitamin B-12 (CYANOCOBALAMIN) 1000 MCG tablet Place 1,000 mcg under the tongue daily.     No current facility-administered medications for this visit.    Allergies-reviewed and updated Allergies  Allergen Reactions   Amlodipine     Leg swelling   Atenolol     bradycardia   Catapres [Clonidine Hydrochloride]     Blood pressure dropped too low after 1st dose   Fosamax [Alendronate Sodium]     Jaw pain.  Aching joints.   Lisinopril     REACTION: ? ACE cough in the past?   Macrobid [Nitrofurantoin Monohydrate Macrocrystals]     Causes elevation of liver enzymes   Nitrofurantoin     Other reaction(s): Liver Disorder   Nitrofurantoin Monohyd Macro    Pravastatin     myalgia   Sulfa Antibiotics     Had joint aches after septra Rx.  Maybe related    Social History   Socioeconomic History   Marital status: Married    Spouse name: Lynise Porr   Number of children: 2   Years of education: 14   Highest education level: Some college, no degree  Occupational History   Occupation: retired    Comment: Retired from [employer].  Tobacco Use   Smoking status: Never   Smokeless tobacco: Never  Vaping Use   Vaping Use: Never used  Substance and Sexual Activity   Alcohol use: No    Alcohol/week: 0.0 standard drinks of alcohol   Drug use: Never   Sexual activity: Yes    Comment: married and monogamous  Other Topics Concern   Not on file  Social History Narrative   ** Merged History Encounter **       Patient lives with spouse Louie Casa.    Social Determinants of Health   Financial Resource Strain: Low Risk  (07/14/2021)   Overall Financial Resource Strain  (CARDIA)    Difficulty of Paying Living Expenses: Not hard at all  Food Insecurity: No Food Insecurity (07/14/2021)   Hunger Vital Sign    Worried About Running Out of Food in the Last Year: Never true    Ran Out of Food in the Last Year: Never true  Transportation Needs: No Transportation Needs (07/14/2021)   PRAPARE - Hydrologist (Medical): No  Lack of Transportation (Non-Medical): No  Physical Activity: Sufficiently Active (07/14/2021)   Exercise Vital Sign    Days of Exercise per Week: 5 days    Minutes of Exercise per Session: 60 min  Stress: No Stress Concern Present (07/14/2021)   [place]    Feeling of Stress : Not at all  Social Connections: [place] (07/14/2021)   Social Connection and Isolation Panel [NHANES]    Frequency of Communication with Friends and Family: More than three times a week    Frequency of Social Gatherings with Friends and Family: More than three times a week    Attends Religious Services: More than 4 times per year    Active Member of [employer] or Organizations: Yes    Attends Music therapist: More than 4 times per year    Marital Status: Married           Objective:  Physical Exam: BP 120/70   Pulse 64   Temp 97.7 F (36.5 C)   Ht 5' 3"  (1.6 m)   Wt 202 lb 3.2 oz (91.7 kg)   SpO2 97%   BMI 35.82 kg/m   Gen: No acute distress, resting comfortably CV: Regular rate and rhythm with no murmurs appreciated Pulm: Normal work of breathing, clear to auscultation bilaterally with no crackles, wheezes, or rhonchi Neuro: Grossly normal, moves all extremities Psych: Normal affect and thought content      Leshawn Houseworth M. Jerline Pain, MD 07/20/2022 10:27 AM

## 2022-08-02 ENCOUNTER — Other Ambulatory Visit: Payer: Self-pay | Admitting: Cardiology

## 2022-08-03 ENCOUNTER — Encounter: Payer: Self-pay | Admitting: Cardiology

## 2022-09-20 ENCOUNTER — Other Ambulatory Visit: Payer: Self-pay | Admitting: Family Medicine

## 2022-09-20 DIAGNOSIS — Z1231 Encounter for screening mammogram for malignant neoplasm of breast: Secondary | ICD-10-CM

## 2022-09-28 ENCOUNTER — Ambulatory Visit: Payer: Medicare Other | Attending: Cardiology | Admitting: Cardiology

## 2022-09-28 ENCOUNTER — Encounter: Payer: Self-pay | Admitting: Cardiology

## 2022-09-28 VITALS — BP 118/70 | HR 67 | Ht 63.0 in | Wt 204.0 lb

## 2022-09-28 DIAGNOSIS — I10 Essential (primary) hypertension: Secondary | ICD-10-CM | POA: Diagnosis present

## 2022-09-28 DIAGNOSIS — I493 Ventricular premature depolarization: Secondary | ICD-10-CM | POA: Diagnosis present

## 2022-09-28 DIAGNOSIS — I340 Nonrheumatic mitral (valve) insufficiency: Secondary | ICD-10-CM | POA: Diagnosis present

## 2022-09-28 NOTE — Progress Notes (Signed)
Cardiology Office Note  Date: 09/28/2022   ID: Scarlette, Hogston 1956/05/03, MRN [id number]  PCP:  Ardith Dark, MD  Cardiologist:  Nona Dell, MD Electrophysiologist:  None   Chief Complaint  Patient presents with   Cardiac follow-up    History of Present Illness: Jacqueline Hayes is a 66 y.o. female last seen in October 2022.  She is here for a routine visit.  Reports no major change in health, has had some fatigue intermittently, but otherwise no new exertional symptoms.  No sudden palpitations or syncope.  I reviewed her medications which are outlined below.  She reports compliance with therapy.  I personally reviewed her ECG today which shows normal sinus rhythm.  Last echocardiogram was in April 2021 as noted below.  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      calcium carbonate (OSCAL) 1500 (600 Ca) MG TABS tablet Take 1 tablet by mouth. Calcium 1200 mg every other day.     fluticasone (FLONASE) 50 MCG/ACT nasal spray Place into the nose.     furosemide (LASIX) 20 MG tablet Take 1 tablet by mouth once daily 90 tablet 3   hydrALAZINE (APRESOLINE) 25 MG tablet TAKE 1 TABLET BY MOUTH THREE TIMES DAILY 270 tablet 1   levothyroxine (SYNTHROID) 125 MCG tablet TAKE 1 TABLET BY MOUTH ONCE DAILY BEFORE BREAKFAST 90 tablet 3   losartan (COZAAR) 100 MG tablet Take 1 tablet by mouth once daily 90 tablet 3    Magnesium 400 MG CAPS Take 250 mg by mouth 3 (three) times daily.      Multiple Vitamins-Minerals (ZINC PO) Take 10 mg by mouth.     potassium chloride (KLOR-CON) 10 MEQ tablet TAKE 1 & 1/2 (ONE & ONE-HALF) TABLETS BY MOUTH ONCE DAILY 135 tablet 3   rosuvastatin (CRESTOR) 10 MG tablet TAKE 1 TABLET BY MOUTH EVERY OTHER DAY 45 tablet 3   vitamin B-12 (CYANOCOBALAMIN) 1000 MCG tablet Place 1,000 mcg under the tongue daily.     No current facility-administered medications for this visit.   Allergies:  Amlodipine, Atenolol, Catapres [clonidine hydrochloride], Fosamax [alendronate sodium], Lisinopril, Macrobid [nitrofurantoin monohydrate macrocrystals], Nitrofurantoin, Nitrofurantoin monohyd macro, Pravastatin, and Sulfa antibiotics   ROS:    Physical Exam: VS:  BP 118/70   Pulse 67   Ht 5\' 3"  (1.6 m)   Wt 204 lb (92.5 kg)   SpO2 98%   BMI 36.14 kg/m , BMI Body mass index is 36.14 kg/m.  Wt Readings from Last 3 Encounters:  09/28/22 204 lb (92.5 kg)  07/20/22 202 lb 3.2 oz (91.7 kg)  10/03/21 196 lb (88.9 kg)    General: Patient appears comfortable at rest. HEENT: Conjunctiva and lids normal. Neck: Supple, no elevated JVP or carotid bruits. Lungs: Clear to auscultation, nonlabored breathing at rest. Cardiac: Regular rate and rhythm, no S3,  1/6 systolic murmur. Abdomen: Soft, bowel sounds present. Extremities: No pitting edema, distal pulses 2+.  ECG:  An ECG dated 09/07/2021 was personally reviewed today and demonstrated:  Sinus bradycardia.  Recent Labwork: 10/03/2021: ALT 15; AST 20; BUN 19; Creatinine, Ser 0.68; Hemoglobin 12.7; Platelets 201; Potassium 5.1; Sodium 142; TSH 3.370     Component Value Date/Time   CHOL 171 10/03/2021 1226   TRIG 134 10/03/2021 1226   HDL 71 10/03/2021 1226   CHOLHDL 2.4 10/03/2021 1226   CHOLHDL 2.9 04/06/2016 1003   VLDL 23 04/06/2016 1003   LDLCALC 77 10/03/2021 1226   LDLDIRECT 113 (H) 01/03/2007 2019    Other Studies Reviewed  Today:  Echocardiogram 03/08/2020: 1. Left ventricular ejection fraction, by estimation, is 60 to 65%. The  left ventricle has normal function. The left ventricle has no regional  wall motion abnormalities. Left ventricular diastolic parameters are  consistent with Grade I diastolic  dysfunction (impaired relaxation).   2. Right ventricular systolic function is normal. The right ventricular  size is normal. There is normal pulmonary artery systolic pressure.   3. The mitral valve is grossly normal. Mild to moderate mitral valve  regurgitation.   4. The aortic valve is tricuspid. Aortic valve regurgitation is not  visualized. No aortic stenosis is present.   5. The inferior vena cava is dilated in size with >50% respiratory  variability, suggesting right atrial pressure of 8 mmHg.   Assessment and Plan:  1.  History of PVCs, no palpitations or syncope.  LVEF 60 to 65% by last assessment.  Continue observation.  2.  Mitral regurgitation, mild to moderate by echocardiogram in April 2021.  We will obtain a follow-up study for surveillance.  No obvious change in heart murmur.  3.  Essential hypertension, blood pressure well controlled today.  She is on hydralazine and Cozaar.  Medication Adjustments/Labs and Tests Ordered: Current medicines are reviewed at length with the patient today.  Concerns regarding medicines are outlined above.   Tests Ordered: Orders Placed This Encounter  Procedures   EKG 12-Lead   ECHOCARDIOGRAM COMPLETE    Medication Changes: No orders of the defined types were placed in this encounter.   Disposition:  Follow up  1 year.  Signed, Satira Sark, MD, [facility] 09/28/2022 11:07 AM    [facility]. [address] [place], [place] [address] Phone: [phone]; Fax: [phone]

## 2022-09-28 NOTE — Patient Instructions (Signed)
Medication Instructions:  Your physician recommends that you continue on your current medications as directed. Please refer to the Current Medication list given to you today.   Labwork: None today  Testing/Procedures: Your physician has requested that you have an echocardiogram. Echocardiography is a painless test that uses sound waves to create images of your heart. It provides your doctor with information about the size and shape of your heart and how well your heart's chambers and valves are working. This procedure takes approximately one hour. There are no restrictions for this procedure. Please do NOT wear cologne, perfume, aftershave, or lotions (deodorant is allowed). Please arrive 15 minutes prior to your appointment time.   Follow-Up: 1 year  Any Other Special Instructions Will Be Listed Below (If Applicable).  If you need a refill on your cardiac medications before your next appointment, please call your pharmacy.  

## 2022-10-06 ENCOUNTER — Ambulatory Visit (HOSPITAL_COMMUNITY)
Admission: RE | Admit: 2022-10-06 | Discharge: 2022-10-06 | Disposition: A | Discharge status: Home / Self Care | Payer: Medicare Other | Source: Ambulatory Visit | Attending: Cardiology | Admitting: Cardiology

## 2022-10-06 DIAGNOSIS — I340 Nonrheumatic mitral (valve) insufficiency: Secondary | ICD-10-CM | POA: Diagnosis present

## 2022-10-06 LAB — ECHOCARDIOGRAM COMPLETE
Area-P 1/2: 3.08 cm2
S' Lateral: 2 cm

## 2022-10-06 NOTE — Progress Notes (Signed)
*  PRELIMINARY RESULTS* Echocardiogram 2D Echocardiogram has been performed.  Stacey Drain 10/06/2022, 1:51 PM

## 2022-10-19 ENCOUNTER — Encounter: Payer: Self-pay | Admitting: Family Medicine

## 2022-10-19 DIAGNOSIS — I10 Essential (primary) hypertension: Secondary | ICD-10-CM

## 2022-10-20 MED ORDER — LOSARTAN POTASSIUM 100 MG PO TABS: 100.0000 mg | ORAL_TABLET | Freq: Every day | ORAL | 3 refills | Status: DC

## 2022-10-25 ENCOUNTER — Other Ambulatory Visit: Payer: Self-pay | Admitting: Family Medicine

## 2022-10-25 DIAGNOSIS — E039 Hypothyroidism, unspecified: Secondary | ICD-10-CM

## 2022-10-31 ENCOUNTER — Ambulatory Visit (INDEPENDENT_AMBULATORY_CARE_PROVIDER_SITE_OTHER): Payer: Medicare Other

## 2022-10-31 VITALS — Wt 204.0 lb

## 2022-10-31 DIAGNOSIS — Z0000 Encounter for general adult medical examination without abnormal findings: Secondary | ICD-10-CM | POA: Diagnosis not present

## 2022-10-31 NOTE — Progress Notes (Signed)
I connected with  Jacqueline GrosLynn M Kwasnik on 10/31/22 by a audio enabled telemedicine application and verified that I am speaking with the correct person using two identifiers.  Patient Location: Home  Provider Location: Office/Clinic  I discussed the limitations of evaluation and management by telemedicine. The patient expressed understanding and agreed to proceed.   Subjective:   Jacqueline Hayes is a 66 y.o. female who presents for Medicare Annual (Subsequent) preventive examination.  Review of Systems     Cardiac Risk Factors include: advanced age (>5355men, 82>65 women);hypertension;dyslipidemia;obesity (BMI >30kg/m2)     Objective:    Today's Vitals   10/31/22 0746  Weight: 204 lb (92.5 kg)   Body mass index is 36.14 kg/m.     10/31/2022    7:50 AM 07/14/2021    9:42 AM 05/31/2020    9:27 AM 05/05/2020    3:43 PM 02/27/2018    1:41 PM 11/22/2017   10:43 AM 08/29/2017    2:37 PM  Advanced Directives  Does Patient Have a Medical Advance Directive? Yes Yes No No No No No  Type of Estate agentAdvance Directive Healthcare Power of [place];Living will Healthcare Power of [place];Living will       Copy of Healthcare Power of Attorney in Chart? No - copy requested No - copy requested       Would patient like information on creating a medical advance directive?   No - Patient declined No - Patient declined No - Patient declined No - Patient declined No - Patient declined    Current Medications (verified) Outpatient Encounter Medications as of 10/31/2022  Medication Sig   aspirin 81 MG tablet Take 81 mg by mouth daily.    augmented betamethasone dipropionate (DIPROLENE-AF) 0.05 % ointment SMARTSIG:Sparingly Topical Twice Daily   calcium carbonate (OSCAL) 1500 (600 Ca) MG TABS tablet Take 1 tablet by mouth. Calcium 1200 mg every other day.   fluticasone (FLONASE) 50 MCG/ACT nasal spray Place into the nose.   furosemide (LASIX) 20 MG tablet Take 1 tablet by mouth once daily   hydrALAZINE (APRESOLINE)  25 MG tablet TAKE 1 TABLET BY MOUTH THREE TIMES DAILY   levothyroxine (SYNTHROID) 125 MCG tablet TAKE 1 TABLET BY MOUTH ONCE DAILY BEFORE BREAKFAST   losartan (COZAAR) 100 MG tablet Take 1 tablet (100 mg total) by mouth daily.   Magnesium 400 MG CAPS Take 250 mg by mouth 3 (three) times daily.    Multiple Vitamins-Minerals (ZINC PO) Take 10 mg by mouth.   potassium chloride (KLOR-CON) 10 MEQ tablet TAKE 1 & 1/2 (ONE & ONE-HALF) TABLETS BY MOUTH ONCE DAILY   rosuvastatin (CRESTOR) 10 MG tablet TAKE 1 TABLET BY MOUTH EVERY OTHER DAY   vitamin B-12 (CYANOCOBALAMIN) 1000 MCG tablet Place 1,000 mcg under the tongue daily.   No facility-administered encounter medications on file as of 10/31/2022.    Allergies (verified) Amlodipine, Atenolol, Catapres [clonidine hydrochloride], Fosamax [alendronate sodium], Lisinopril, Macrobid [nitrofurantoin monohydrate macrocrystals], Nitrofurantoin, Nitrofurantoin monohyd macro, Pravastatin, and Sulfa antibiotics   History: Past Medical History:  Diagnosis Date   Essential hypertension    History of kidney stones    Hypothyroidism    Mitral regurgitation    PVC's (premature ventricular contractions)    Past Surgical History:  Procedure Laterality Date   CATARACT EXTRACTION, BILATERAL Bilateral    FOOT SURGERY Left    Childhood   GASTRIC BYPASS  04/2008   NASAL SEPTUM SURGERY  1988   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   TONSILLECTOMY     1963   Family History  Problem Relation Age of Onset   Hypertension Mother    Diabetes Mother    Heart disease Father    Hypertension Sister    Diabetes Sister    Stroke Sister    Stroke Sister    Diabetes Sister    Breast cancer Sister    Hypertension Son    Hypertension Son    Diabetes Son        Type 1 Diabetes   Breast cancer Maternal Aunt    Social History   Socioeconomic History   Marital status: Married    Spouse name: Rennae Ferraiolo   Number of children: 2   Years of education: 14   Highest education level: Some college, no degree  Occupational History   Occupation: retired    Comment: Retired from [employer].  Tobacco Use   Smoking status: Never   Smokeless tobacco: Never  Vaping Use   Vaping Use: Never used  Substance and Sexual Activity   Alcohol use: No    Alcohol/week: 0.0 standard drinks of alcohol   Drug use: Never   Sexual activity: Yes    Comment: married and monogamous  Other Topics Concern   Not on file  Social History Narrative   ** Merged History Encounter **       Patient lives with spouse Harvie Heck.    Social Determinants of Health   Financial Resource Strain: Low Risk  (10/31/2022)   Overall Financial Resource Strain (CARDIA)    Difficulty of Paying Living Expenses: Not hard at all  Food Insecurity: No Food Insecurity (10/31/2022)   Hunger Vital Sign    Worried About Running Out of Food in the Last Year: Never true    Ran Out of Food in the Last Year: Never true  Transportation Needs: No Transportation Needs (10/31/2022)   PRAPARE - Administrator, Civil Service (Medical): No    Lack of Transportation (Non-Medical): No  Physical Activity: Inactive (10/31/2022)   Exercise Vital Sign    Days of Exercise per Week: 0 days    Minutes of Exercise per Session: 0 min  Stress: No Stress Concern Present (10/31/2022)   [employer] of Occupational Health - Occupational Stress Questionnaire    Feeling of Stress : Not at all  Social Connections: Moderately Integrated (10/31/2022)   Social Connection and Isolation Panel [NHANES]    Frequency of Communication with Friends and Family: More than three times a week    Frequency of Social Gatherings with Friends and Family: More than three times a week    Attends Religious Services: More than 4 times per year    Active Member of [employer] or Organizations: No    Attends Engineer, structural: Never     Marital Status: Married    Tobacco Counseling Counseling given: Not Answered   Clinical Intake:  Pre-visit preparation completed: Yes  Pain : No/denies pain     BMI - recorded: 36.14 Nutritional Status: BMI > 30  Obese Nutritional Risks: None Diabetes: No  How often do you need to have someone help you when you read instructions, pamphlets, or other written materials from your doctor or pharmacy?: 1 - Never  Diabetic?no  Interpreter Needed?: No  Information entered by :: Lanier Ensign, LPN   Activities of Daily Living    10/31/2022    7:51 AM  In your present state of health, do  you have any difficulty performing the following activities:  Hearing? 0  Vision? 0  Difficulty concentrating or making decisions? 0  Walking or climbing stairs? 0  Dressing or bathing? 0  Doing errands, shopping? 0  Preparing Food and eating ? N  Using the Toilet? N  In the past six months, have you accidently leaked urine? N  Do you have problems with loss of bowel control? N  Managing your Medications? N  Managing your Finances? N  Housekeeping or managing your Housekeeping? N    Patient Care Team: Ardith Dark, MD as PCP - General (Family Medicine) Jonelle Sidle, MD as PCP - Cardiology (Cardiology) Daisy Lazar, DO (Optometry)  Indicate any recent Medical Services you may have received from other than [facility] providers in the past year (date may be approximate).     Assessment:   This is a routine wellness examination for [employer].  Hearing/Vision screen Hearing Screening - Comments:: Pt denies any hearing issues  Vision Screening - Comments:: Pt follows up with Dr Charise Killian for annul eye exams   Dietary issues and exercise activities discussed: Current Exercise Habits: The patient does not participate in regular exercise at present   Goals Addressed             This Visit's Progress    Patient Stated       To lose weight        Depression Screen     10/31/2022    7:49 AM 10/03/2021   11:41 AM 07/14/2021    9:35 AM 02/28/2021   10:56 AM 01/26/2021    1:56 PM 05/31/2020    9:27 AM 05/05/2020    3:43 PM  PHQ 2/9 Scores  PHQ - 2 Score 0 0 0 0 0 0 0  PHQ- 9 Score  1 0  1      Fall Risk    10/31/2022    7:51 AM 07/20/2022    9:53 AM 07/14/2021    9:36 AM 05/31/2020    9:26 AM 05/05/2020    3:43 PM  Fall Risk   Falls in the past year? 0 0 0 0 0  Number falls in past yr: 0 0 0  0  Injury with Fall? 0 0 0  0  Risk for fall due to : Impaired vision No Fall Risks No Fall Risks    Follow up Falls prevention discussed Falls evaluation completed Falls evaluation completed      FALL RISK PREVENTION PERTAINING TO THE HOME:  Any stairs in or around the home? No  If so, are there any without handrails? No  Home free of loose throw rugs in walkways, pet beds, electrical cords, etc? Yes  Adequate lighting in your home to reduce risk of falls? Yes   ASSISTIVE DEVICES UTILIZED TO PREVENT FALLS:  Life alert? No  Use of a cane, walker or w/c? No  Grab bars in the bathroom? No  Shower chair or bench in shower? No  Elevated toilet seat or a handicapped toilet? No   TIMED UP AND GO:  Was the test performed? No .  Cognitive Function:        10/31/2022    7:52 AM 07/14/2021    9:54 AM  6CIT Screen  What Year? 0 points 0 points  What month? 0 points 0 points  What time? 0 points 0 points  Count back from 20 0 points 0 points  Months in reverse 0 points 0 points  Repeat phrase  0 points 0 points  Total Score 0 points 0 points    Immunizations Immunization History  Administered Date(s) Administered   Influenza,inj,Quad PF,6+ Mos 07/16/2019   Influenza-Unspecified 08/10/2020, 08/08/2021, 07/28/2022   PFIZER Comirnaty(Gray Top)Covid-19 Tri-Sucrose Vaccine 02/28/2021   PFIZER(Purple Top)SARS-COV-2 Vaccination 12/25/2019, 01/15/2020, 08/25/2020   Pfizer Covid-19 Vaccine Bivalent Booster 37yrs & up 09/05/2021   Pneumococcal Conjugate-13  08/10/2020   Pneumococcal Polysaccharide-23 07/25/2019   Respiratory Syncytial Virus Vaccine,Recomb Aduvanted(Arexvy) 10/09/2022   Td 06/21/1999   Tdap 05/31/2011, 10/03/2021   Zoster Recombinat (Shingrix) 11/23/2020, 04/13/2021    TDAP status: Up to date  Flu Vaccine status: Up to date  Pneumococcal vaccine status: Up to date  Covid-19 vaccine status: Completed vaccines  Qualifies for Shingles Vaccine? Yes   Zostavax completed Yes   Shingrix Completed?: Yes  Screening Tests Health Maintenance  Topic Date Due   COVID-19 Vaccine (6 - 2023-24 season) 07/21/2022   Medicare Annual Wellness (AWV)  11/01/2023   MAMMOGRAM  11/03/2023   Pneumonia Vaccine 33+ Years old (3 - PPSV23 or PCV20) 07/24/2024   COLONOSCOPY (Pts 45-53yrs Insurance coverage will need to be confirmed)  03/30/2025   DTaP/Tdap/Td (4 - Td or Tdap) 10/04/2031   INFLUENZA VACCINE  Completed   DEXA SCAN  Completed   Hepatitis C Screening  Completed   Zoster Vaccines- Shingrix  Completed   HPV VACCINES  Aged Out    Health Maintenance  Health Maintenance Due  Topic Date Due   COVID-19 Vaccine (6 - 2023-24 season) 07/21/2022    Colorectal cancer screening: Type of screening: Colonoscopy. Completed 03/31/15. Repeat every 10 years  Mammogram status: Completed 11/02/21. Repeat every year scheduled for 11/07/22  Bone Density status: Completed 09/08/20. Results reflect: Bone density results: OSTEOPOROSIS. Repeat every 2 years.   Additional Screening:  Hepatitis C Screening:  Completed 10/21/15  Vision Screening: Recommended annual ophthalmology exams for early detection of glaucoma and other disorders of the eye. Is the patient up to date with their annual eye exam?  Yes  Who is the provider or what is the name of the office in which the patient attends annual eye exams? Dr Charise Killian  If pt is not established with a provider, would they like to be referred to a provider to establish care? No .   Dental Screening:  Recommended annual dental exams for proper oral hygiene  Community Resource Referral / Chronic Care Management: CRR required this visit?  No   CCM required this visit?  No      Plan:     I have personally reviewed and noted the following in the patient's chart:   Medical and social history Use of alcohol, tobacco or illicit drugs  Current medications and supplements including opioid prescriptions. Patient is not currently taking opioid prescriptions. Functional ability and status Nutritional status Physical activity Advanced directives List of other physicians Hospitalizations, surgeries, and ER visits in previous 12 months Vitals Screenings to include cognitive, depression, and falls Referrals and appointments  In addition, I have reviewed and discussed with patient certain preventive protocols, quality metrics, and best practice recommendations. A written personalized care plan for preventive services as well as general preventive health recommendations were provided to patient.     Marzella Schlein, LPN   24/40/1027   Nurse Notes: none

## 2022-10-31 NOTE — Patient Instructions (Signed)
Jacqueline Hayes , Thank you for taking time to come for your Medicare Wellness Visit. I appreciate your ongoing commitment to your health goals. Please review the following plan we discussed and let me know if I can assist you in the future.   These are the goals we discussed:  Goals       Set My Weight Loss Goal (pt-stated)      Follow Up Date 07/14/2022    Loss 10lb and stay active.  Patient will get more exercise and eat healthier. Patient stated that she will watch her salt intake and consumption of fatty foods. Patient stated she wanted to increase protein.    Why is this important?   Losing only 5 to 15 percent of your weight makes a big difference in your health.            This is a list of the screening recommended for you and due dates:  Health Maintenance  Topic Date Due   COVID-19 Vaccine (6 - 2023-24 season) 07/21/2022   Flu Shot  02/18/2023*   Medicare Annual Wellness Visit  11/01/2023   Mammogram  11/03/2023   Pneumonia Vaccine (3 - PPSV23 or PCV20) 07/24/2024   Colon Cancer Screening  03/30/2025   DTaP/Tdap/Td vaccine (4 - Td or Tdap) 10/04/2031   DEXA scan (bone density measurement)  Completed   Hepatitis C Screening: USPSTF Recommendation to screen - Ages 48-79 yo.  Completed   Zoster (Shingles) Vaccine  Completed   HPV Vaccine  Aged Out  *Topic was postponed. The date shown is not the original due date.    Advanced directives: Please bring a copy of your health care power of attorney and living will to the office at your convenience.  Conditions/risks identified: to lose weight   Next appointment: Follow up in one year for your annual wellness visit    Preventive Care 65 Years and Older, Female Preventive care refers to lifestyle choices and visits with your health care provider that can promote health and wellness. What does preventive care include? A yearly physical exam. This is also called an annual well check. Dental exams once or twice a  year. Routine eye exams. Ask your health care provider how often you should have your eyes checked. Personal lifestyle choices, including: Daily care of your teeth and gums. Regular physical activity. Eating a healthy diet. Avoiding tobacco and drug use. Limiting alcohol use. Practicing safe sex. Taking low-dose aspirin every day. Taking vitamin and mineral supplements as recommended by your health care provider. What happens during an annual well check? The services and screenings done by your health care provider during your annual well check will depend on your age, overall health, lifestyle risk factors, and family history of disease. Counseling  Your health care provider may ask you questions about your: Alcohol use. Tobacco use. Drug use. Emotional well-being. Home and relationship well-being. Sexual activity. Eating habits. History of falls. Memory and ability to understand (cognition). Work and work Astronomer. Reproductive health. Screening  You may have the following tests or measurements: Height, weight, and BMI. Blood pressure. Lipid and cholesterol levels. These may be checked every 5 years, or more frequently if you are over 77 years old. Skin check. Lung cancer screening. You may have this screening every year starting at age 38 if you have a 30-pack-year history of smoking and currently smoke or have quit within the past 15 years. Fecal occult blood test (FOBT) of the stool. You may have this test  every year starting at age 89. Flexible sigmoidoscopy or colonoscopy. You may have a sigmoidoscopy every 5 years or a colonoscopy every 10 years starting at age 80. Hepatitis C blood test. Hepatitis B blood test. Sexually transmitted disease (STD) testing. Diabetes screening. This is done by checking your blood sugar (glucose) after you have not eaten for a while (fasting). You may have this done every 1-3 years. Bone density scan. This is done to screen for  osteoporosis. You may have this done starting at age 68. Mammogram. This may be done every 1-2 years. Talk to your health care provider about how often you should have regular mammograms. Talk with your health care provider about your test results, treatment options, and if necessary, the need for more tests. Vaccines  Your health care provider may recommend certain vaccines, such as: Influenza vaccine. This is recommended every year. Tetanus, diphtheria, and acellular pertussis (Tdap, Td) vaccine. You may need a Td booster every 10 years. Zoster vaccine. You may need this after age 10. Pneumococcal 13-valent conjugate (PCV13) vaccine. One dose is recommended after age 33. Pneumococcal polysaccharide (PPSV23) vaccine. One dose is recommended after age 3. Talk to your health care provider about which screenings and vaccines you need and how often you need them. This information is not intended to replace advice given to you by your health care provider. Make sure you discuss any questions you have with your health care provider. Document Released: 12/03/2015 Document Revised: 07/26/2016 Document Reviewed: 09/07/2015 Elsevier Interactive Patient Education  2017 [place] Prevention in the Home Falls can cause injuries. They can happen to people of all ages. There are many things you can do to make your home safe and to help prevent falls. What can I do on the outside of my home? Regularly fix the edges of walkways and driveways and fix any cracks. Remove anything that might make you trip as you walk through a door, such as a raised step or threshold. Trim any bushes or trees on the path to your home. Use bright outdoor lighting. Clear any walking paths of anything that might make someone trip, such as rocks or tools. Regularly check to see if handrails are loose or broken. Make sure that both sides of any steps have handrails. Any raised decks and porches should have guardrails on  the edges. Have any leaves, snow, or ice cleared regularly. Use sand or salt on walking paths during winter. Clean up any spills in your garage right away. This includes oil or grease spills. What can I do in the bathroom? Use night lights. Install grab bars by the toilet and in the tub and shower. Do not use towel bars as grab bars. Use non-skid mats or decals in the tub or shower. If you need to sit down in the shower, use a plastic, non-slip stool. Keep the floor dry. Clean up any water that spills on the floor as soon as it happens. Remove soap buildup in the tub or shower regularly. Attach bath mats securely with double-sided non-slip rug tape. Do not have throw rugs and other things on the floor that can make you trip. What can I do in the bedroom? Use night lights. Make sure that you have a light by your bed that is easy to reach. Do not use any sheets or blankets that are too big for your bed. They should not hang down onto the floor. Have a firm chair that has side arms. You can use  this for support while you get dressed. Do not have throw rugs and other things on the floor that can make you trip. What can I do in the kitchen? Clean up any spills right away. Avoid walking on wet floors. Keep items that you use a lot in easy-to-reach places. If you need to reach something above you, use a strong step stool that has a grab bar. Keep electrical cords out of the way. Do not use floor polish or wax that makes floors slippery. If you must use wax, use non-skid floor wax. Do not have throw rugs and other things on the floor that can make you trip. What can I do with my stairs? Do not leave any items on the stairs. Make sure that there are handrails on both sides of the stairs and use them. Fix handrails that are broken or loose. Make sure that handrails are as long as the stairways. Check any carpeting to make sure that it is firmly attached to the stairs. Fix any carpet that is loose  or worn. Avoid having throw rugs at the top or bottom of the stairs. If you do have throw rugs, attach them to the floor with carpet tape. Make sure that you have a light switch at the top of the stairs and the bottom of the stairs. If you do not have them, ask someone to add them for you. What else can I do to help prevent falls? Wear shoes that: Do not have high heels. Have rubber bottoms. Are comfortable and fit you well. Are closed at the toe. Do not wear sandals. If you use a stepladder: Make sure that it is fully opened. Do not climb a closed stepladder. Make sure that both sides of the stepladder are locked into place. Ask someone to hold it for you, if possible. Clearly mark and make sure that you can see: Any grab bars or handrails. First and last steps. Where the edge of each step is. Use tools that help you move around (mobility aids) if they are needed. These include: Canes. Walkers. Scooters. Crutches. Turn on the lights when you go into a dark area. Replace any light bulbs as soon as they burn out. Set up your furniture so you have a clear path. Avoid moving your furniture around. If any of your floors are uneven, fix them. If there are any pets around you, be aware of where they are. Review your medicines with your doctor. Some medicines can make you feel dizzy. This can increase your chance of falling. Ask your doctor what other things that you can do to help prevent falls. This information is not intended to replace advice given to you by your health care provider. Make sure you discuss any questions you have with your health care provider. Document Released: 09/02/2009 Document Revised: 04/13/2016 Document Reviewed: 12/11/2014 Elsevier Interactive Patient Education  2017 [employer].

## 2022-11-07 ENCOUNTER — Other Ambulatory Visit: Payer: Self-pay | Admitting: Family Medicine

## 2022-11-07 ENCOUNTER — Ambulatory Visit
Admission: RE | Admit: 2022-11-07 | Discharge: 2022-11-07 | Disposition: A | Discharge status: Home / Self Care | Payer: Medicare Other | Source: Ambulatory Visit | Attending: Family Medicine | Admitting: Family Medicine

## 2022-11-07 DIAGNOSIS — Z1231 Encounter for screening mammogram for malignant neoplasm of breast: Secondary | ICD-10-CM

## 2022-11-08 ENCOUNTER — Encounter: Payer: Self-pay | Admitting: Podiatry

## 2022-11-08 ENCOUNTER — Ambulatory Visit (INDEPENDENT_AMBULATORY_CARE_PROVIDER_SITE_OTHER): Payer: Medicare Other | Admitting: Podiatry

## 2022-11-08 DIAGNOSIS — L600 Ingrowing nail: Secondary | ICD-10-CM | POA: Diagnosis not present

## 2022-11-08 DIAGNOSIS — B351 Tinea unguium: Secondary | ICD-10-CM

## 2022-11-08 NOTE — Patient Instructions (Signed)

## 2022-11-08 NOTE — Progress Notes (Signed)
Subjective:   Patient ID: Jacqueline Hayes, female   DOB: 66 y.o.   MRN: [id number]   HPI Patient presents with 2 separate problems with the right big toenail lateral border being chronically ingrown that she has to digging out at different times and also having thickness and moderate discomfort discoloration of the left big toenail of the long-term history.  Patient does not smoke likes to be active   Review of Systems  All other systems reviewed and are negative.       Objective:  Physical Exam Vitals and nursing note reviewed.  Constitutional:      Appearance: She is well-developed.  Pulmonary:     Effort: Pulmonary effort is normal.  Musculoskeletal:        General: Normal range of motion.  Skin:    General: Skin is warm.  Neurological:     Mental Status: She is alert.     Neurovascular status intact muscle strength found to be adequate range of motion adequate with patient found to have an incurvated lateral border right hallux painful when pressed with irritation of the tissue no drainage and the left is thickened dystrophic with multiple layers present.  Good digital perfusion well-oriented x 3     Assessment:  Ingrown toenail deformity right hallux lateral border with pain along with thickened hallux nail left dystrophic     Plan:  H&P discussed both conditions with patient.  At this point for the right I have recommended correction of deformity explaining the process procedure risk and she signed consent form and today I infiltrated 60 mg like Marcaine mixture sterile prep done using sterile instrumentation remove the lateral border exposed matrix applied phenol 3 applications 30 seconds followed by alcohol lavage sterile dressing gave instructions on soaks and to wear dressing for 24 hours and take off earlier if throbbing were to occur.  The left I do not recommend treatment I educated her on damaged fungal toenail deformity and the only consideration would be removal  and we will get a hold off on that at this time and patient will just keep it smooth and use polish as needed

## 2022-11-17 ENCOUNTER — Ambulatory Visit: Payer: Medicare Other

## 2022-12-01 ENCOUNTER — Ambulatory Visit: Payer: Medicare Other | Admitting: Cardiology

## 2022-12-11 ENCOUNTER — Other Ambulatory Visit: Payer: Self-pay | Admitting: Family Medicine

## 2022-12-11 DIAGNOSIS — I5032 Chronic diastolic (congestive) heart failure: Secondary | ICD-10-CM

## 2022-12-11 NOTE — Telephone Encounter (Signed)
LAST APPOINTMENT DATE: 07/20/22 NEXT APPOINTMENT DATE: 12/18/22  MEDICATION:furosemide (LASIX) 20 MG tablet   Is the patient out of medication? A week's worth  PHARMACY:[facility], [place] - [address] [place], [place] [address] Phone: [phone]  Fax: [phone]

## 2022-12-11 NOTE — Telephone Encounter (Signed)
Last refill by Zenia Resides, MD On 12/28/2021

## 2022-12-12 ENCOUNTER — Other Ambulatory Visit: Payer: Self-pay | Admitting: *Deleted

## 2022-12-12 DIAGNOSIS — I5032 Chronic diastolic (congestive) heart failure: Secondary | ICD-10-CM

## 2022-12-12 MED ORDER — FUROSEMIDE 20 MG PO TABS: 20.0000 mg | ORAL_TABLET | Freq: Every day | ORAL | 0 refills | Status: DC

## 2022-12-18 ENCOUNTER — Encounter: Payer: Self-pay | Admitting: Family Medicine

## 2022-12-18 ENCOUNTER — Ambulatory Visit (INDEPENDENT_AMBULATORY_CARE_PROVIDER_SITE_OTHER): Payer: Medicare Other | Admitting: Family Medicine

## 2022-12-18 VITALS — BP 106/69 | HR 59 | Temp 97.7°F | Ht 64.0 in | Wt 195.0 lb

## 2022-12-18 DIAGNOSIS — I10 Essential (primary) hypertension: Secondary | ICD-10-CM | POA: Diagnosis not present

## 2022-12-18 DIAGNOSIS — M542 Cervicalgia: Secondary | ICD-10-CM | POA: Diagnosis not present

## 2022-12-18 NOTE — Assessment & Plan Note (Signed)
Blood pressure at goal on current regimen hydralazine 25 mg 3 times daily and losartan 100 mg daily.

## 2022-12-18 NOTE — Progress Notes (Signed)
   Jacqueline Hayes is a 67 y.o. female who presents today for an office visit.  Assessment/Plan:  Chronic Problems Addressed Today: Neck pain No red flags.  Had a x-ray a couple of years ago that showed degenerative changes.  She likely had a flareup recently.  Her exam is overall reassuring today.  We need to avoid NSAIDs due to history of gastric bypass.  Will refer to physical therapy.  We did discuss prednisone however we will hold off on for now as symptoms are not that severe.  She can try heating pad as well.  If not improving with physical therapy will refer to sports medicine.  Essential hypertension Blood pressure at goal on current regimen hydralazine 25 mg 3 times daily and losartan 100 mg daily.     Subjective:  HPI:  See A/P for status of chronic conditions.  Patient is here for follow-up.  She is having issues with left arm pain and left neck pain.  She was last seen here about 5 months ago to establish care.  She has had ongoing issues with neck and arm pain for a couple of years. Her previous PCP has seen her for this as well. 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    She has had intermittent left neck pain and left arm pain over the last year or so.  Symptoms come and go.  No obvious aggravating or alleviating factors.  Most recently started several days ago.  This does seem to be improving in last day or so.  Pain is better with certain motions.  Pain radiates from her neck into her arm.  She does feel slight weakness in her left arm.  No numbness or tingling.        Objective:  Physical Exam: BP 106/69   Pulse (!) 59   Temp 97.7 F (36.5 C) (Temporal)   Ht 5\' 4"  (1.626 m)   Wt 195 lb (88.5 kg)   SpO2 99%   BMI 33.47 kg/m   Gen: No acute distress, resting comfortably CV: Regular rate and rhythm with no murmurs appreciated Pulm: Normal work of breathing, clear to  auscultation bilaterally with no crackles, wheezes, or rhonchi MSK: - Neck: No deformities.  Crepitus with active and passive range of motion noted.  Spurling negative. - Arms: Neurovascular intact distally.  Strength 5 out of 5 throughout.  Sensation light touch intact throughout. Neuro: Grossly normal, moves all extremities Psych: Normal affect and thought content      Keatin Benham M. Jerline Pain, MD 12/18/2022 10:10 AM

## 2022-12-18 NOTE — Therapy (Unsigned)
OUTPATIENT PHYSICAL THERAPY CERVICAL EVALUATION   Patient Name: Jacqueline Hayes MRN: [id number] DOB:[birth year], 67 y.o., female Today's Date: 12/19/2022  END OF SESSION:  PT End of Session - 12/19/22 1106     Visit Number 1    Number of Visits 12    Date for PT Re-Evaluation 01/30/23    Authorization Type [employer] visits med [place]    PT Start Time 1106    PT Stop Time 1146    PT Time Calculation (min) 40 min    Activity Tolerance Patient tolerated treatment well    Behavior During Therapy WFL for tasks assessed/performed             Past Medical History:  Diagnosis Date   Essential hypertension    History of kidney stones    Hypothyroidism    Mitral regurgitation    PVC's (premature ventricular contractions)    Past Surgical History:  Procedure Laterality Date   CATARACT EXTRACTION, BILATERAL Bilateral    FOOT SURGERY Left    Childhood   GASTRIC BYPASS  04/2008   NASAL SEPTUM SURGERY  1988   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   TONSILLECTOMY     1963   Patient Active Problem List   Diagnosis Date Noted   Laceration of finger of left hand 10/03/2021   Neck pain 01/26/2021   Mitral regurgitation 05/05/2020   Insomnia 05/05/2019   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 04/02/2019   CHF (congestive heart failure), NYHA class I, chronic, diastolic (HCC) 07/19/2017   Osteoporosis 04/06/2016   Chronic venous insufficiency 04/06/2016   H/O bariatric surgery 03/25/2012   Hearing loss 11/23/2011   Fuchs' corneal dystrophy 05/31/2011   Hypothyroidism 01/17/2007   Dyslipidemia 01/17/2007   Essential hypertension 01/17/2007    PCP: Ardith Dark, MD  REFERRING PROVIDER: Ardith Dark, MD  REFERRING DIAG: M54.2 (ICD-10-CM) - Neck pain  THERAPY DIAG:  Cervicalgia - Plan: PT plan of care cert/re-cert  Muscle weakness (generalized) - Plan: PT plan of care cert/re-cert  Rationale for Evaluation and  Treatment: Rehabilitation  ONSET DATE: months off and on  SUBJECTIVE:                                                                                                                                                                                                         SUBJECTIVE STATEMENT: States her pain is off and on and gets worse/better. States that 2 years ago she was diagnosed with stenosis. States she was having some tenderness  behind her left ear. In the last 6 months she has been having left upper arm pain and aching in her left neck. Stats that is is currently doing better. Pain is only on the left side. States she takes ibuprofen at night which helps. Sometimes it is difficult to get comfortable at night.   Difficult to dress and get arm behind back. Right hand dominant  PERTINENT HISTORY:  Osteopenia, hs of sleep apnea, surgery on throat - to widen her throat (25 years ago), gastric bypass  PAIN:  Are you having pain? Yes: NPRS scale: 4/10 Pain location: left neck and left upper arm Pain description: stabbing Aggravating factors: movement of her arm,  Relieving factors: rest,   PRECAUTIONS: None  WEIGHT BEARING RESTRICTIONS: No  FALLS:  Has patient fallen in last 6 months? No  OCCUPATION: retired - likes to play with grand children  PLOF: Independent  PATIENT GOALS: to have less pain and be able ot manage her pain so she doesn't have to take NSAIDs   OBJECTIVE:   DIAGNOSTIC FINDINGS:  Xray 5/22 FINDINGS: Alignment within normal limits. Mild right neural foraminal stenosis at C5-C6. Moderate right neural foraminal stenosis at C6-C7. Mild left neural foraminal stenosis at C6-C7. Prevertebral soft tissues are normal in thickness. Visualized lung apices are clear.   IMPRESSION: Degenerative changes of the cervical spine as discussed above.  PATIENT SURVEYS:  FOTO 57.5%   COGNITION: Overall cognitive status: Within functional limits for tasks  assessed  SENSATION: WFL  POSTURE: rounded shoulders, forward head, posterior pelvic tilt, and flexed trunk   PALPATION: Tenderness to palpation along left UT and B pecs  CERVICAL ROM:   Active ROM A/PROM (deg) eval  Flexion Hinges at C6 50% limited   Extension 30  Right lateral flexion   Left lateral flexion   Right rotation 45*  Left rotation 50   (Blank rows = not tested) *pain in left side of neck    UE Measurements Upper Extremity Right 12/19/2022 Left 12/19/2022   A/PROM MMT A/PROM MMT  Shoulder Flexion 140 4 120* 4-*  Shoulder Extension      Shoulder Abduction 120 4 105* 4-*  Shoulder Adduction      Shoulder Internal Rotation Reaches to T12 SP 3+ Reaches to T12 SP*- and wings 3+*  Shoulder External Rotation Reaches to C7SP 3+ Reaches to C7SP* 3+*  Elbow Flexion      Elbow Extension      Wrist Flexion      Wrist Extension      Wrist Supination      Wrist Pronation      Wrist Ulnar Deviation      Wrist Radial Deviation      Grip Strength NA  NA     (Blank rows = not tested)   * pain - twinge   TODAY'S TREATMENT:  DATE: 12/19/2022    Therapeutic Exercise:  Aerobic: Supine: Chin tuck times fifteen 5-second holds, self massage to upper trap and sternocleidomastoid on left Prone:  Seated:  Standing: Neuromuscular Re-education: Posture education sitting on symptoms not tailbone feet on floor sitting tall 8 minutes Manual Therapy: Therapeutic Activity: Self Care: Trigger Point Dry Needling:  Modalities:   PATIENT EDUCATION:  Education details: on current presentation, on HEP, on clinical outcomes score and POC, posture, anatomy, rationale for interventions Person educated: Patient Education method: Explanation, Demonstration, and Handouts Education comprehension: verbalized understanding   HOME EXERCISE  PROGRAM: [id number]  ASSESSMENT:  CLINICAL IMPRESSION: Patient presents to physical therapy with complaints of left cervical pain radiating into left upper arm.  Pain has been going on for a while but is episodic in nature.  Patient demonstrates limitations in posture, range of motion, and strength.  Patient would greatly benefit from skilled physical therapy to reduce further injury and improve overall quality of life.   OBJECTIVE IMPAIRMENTS: decreased activity tolerance, decreased ROM, decreased strength, improper body mechanics, and pain.   ACTIVITY LIMITATIONS: carrying, lifting, sleeping, bathing, dressing, reach over head, and locomotion level  PARTICIPATION LIMITATIONS: cleaning and community activity  PERSONAL FACTORS: Age, Fitness, and Time since onset of injury/illness/exacerbation are also affecting patient's functional outcome.   REHAB POTENTIAL: Good  CLINICAL DECISION MAKING: Stable/uncomplicated  EVALUATION COMPLEXITY: Low   GOALS: Goals reviewed with patient? yes  SHORT TERM GOALS: Target date: 01/09/2023  Patient will be independent in self management strategies to improve quality of life and functional outcomes. Baseline: New Program Goal status: INITIAL  2.  Patient will report at least 50% improvement in overall symptoms and/or function to demonstrate improved functional mobility Baseline: 0% better Goal status: INITIAL  3.  Patient will be ale to demonstrate chin tuck and cervical flexion without hinging at C6/7 to demonstrate improved cervical ROM Baseline:  Goal status: INITIAL     LONG TERM GOALS: Target date: 01/30/2023   Patient will report at least 75% improvement in overall symptoms and/or function to demonstrate improved functional mobility Baseline: 0% better Goal status: INITIAL  2.  Patient will improve score on FOTO outcomes measure to projected score to demonstrate overall improved function and QOL Baseline: see above Goal status:  INITIAL  3.  And patient will demonstrate pain-free left upper extremity range of motion Baseline: painful Goal status: INITIAL  4.  Patient will demonstrate at least 4/5 MMT in B UE  Baseline:  Goal status: INITIAL    PLAN:  PT FREQUENCY: 2x/week  PT DURATION: 6 weeks  PLANNED INTERVENTIONS: Therapeutic exercises, Therapeutic activity, Neuromuscular re-education, Balance training, Gait training, Patient/Family education, Self Care, Joint mobilization, Joint manipulation, Vestibular training, Canalith repositioning, Aquatic Therapy, Dry Needling, Electrical stimulation, Spinal manipulation, Spinal mobilization, Cryotherapy, Moist heat, Traction, Ultrasound, Parrafin, Ionotophoresis 4mg /ml Dexamethasone, Manual therapy, and Re-evaluation  PLAN FOR NEXT SESSION: Chin tucks, posture, core and minutes, upper cervical traction   12:08 PM, 12/19/22 Jerene Pitch, DPT Physical Therapy with [facility]

## 2022-12-18 NOTE — Patient Instructions (Signed)
It was very nice to see you today!  You have a flareup of a pinched nerve in your neck.  I will refer you to see a physical therapist.  You can use a heating pad to the area if needed.  Please come back in September for your regular checkup.  Come back sooner if needed.  Take care, Dr Jerline Pain  PLEASE NOTE:  If you had any lab tests, please let us know if you have not heard back within a few days. You may see your results on mychart before we have a chance to review them but we will give you a call once they are reviewed by [place].   If we ordered any referrals today, please let us know if you have not heard from their office within the next week.   If you had any urgent prescriptions sent in today, please check with the pharmacy within an hour of our visit to make sure the prescription was transmitted appropriately.   Please try these tips to maintain a healthy lifestyle:  Eat at least 3 REAL meals and 1-2 snacks per day.  Aim for no more than 5 hours between eating.  If you eat breakfast, please do so within one hour of getting up.   Each meal should contain half fruits/vegetables, one quarter protein, and one quarter carbs (no bigger than a computer mouse)  Cut down on sweet beverages. This includes juice, soda, and sweet tea.   Drink at least 1 glass of water with each meal and aim for at least 8 glasses per day  Exercise at least 150 minutes every week.

## 2022-12-18 NOTE — Assessment & Plan Note (Addendum)
No red flags.  Had a x-ray a couple of years ago that showed degenerative changes.  She likely had a flareup recently.  Her exam is overall reassuring today.  We need to avoid NSAIDs due to history of gastric bypass.  Will refer to physical therapy.  We did discuss prednisone however we will hold off on for now as symptoms are not that severe.  She can try heating pad as well.  If not improving with physical therapy will refer to sports medicine.

## 2022-12-19 ENCOUNTER — Ambulatory Visit: Payer: Medicare Other | Admitting: Physical Therapy

## 2022-12-19 ENCOUNTER — Encounter: Payer: Self-pay | Admitting: Physical Therapy

## 2022-12-19 DIAGNOSIS — M542 Cervicalgia: Secondary | ICD-10-CM

## 2022-12-19 DIAGNOSIS — M6281 Muscle weakness (generalized): Secondary | ICD-10-CM | POA: Diagnosis not present

## 2022-12-20 NOTE — Therapy (Unsigned)
OUTPATIENT PHYSICAL THERAPY TREATMENT NOTE   Patient Name: Jacqueline Hayes MRN: [id number] DOB:[birth year], 67 y.o., female Today's Date: 12/21/2022  PCP: Ardith Dark, MD   REFERRING PROVIDER: Ardith Dark, MD  END OF SESSION:   PT End of Session - 12/21/22 1025     Visit Number 2    Number of Visits 12    Date for PT Re-Evaluation 01/30/23    Authorization Type [employer] visits med [place]    PT Start Time 1025    PT Stop Time 1104    PT Time Calculation (min) 39 min    Activity Tolerance Patient tolerated treatment well    Behavior During Therapy WFL for tasks assessed/performed             Past Medical History:  Diagnosis Date   Essential hypertension    History of kidney stones    Hypothyroidism    Mitral regurgitation    PVC's (premature ventricular contractions)    Past Surgical History:  Procedure Laterality Date   CATARACT EXTRACTION, BILATERAL Bilateral    FOOT SURGERY Left    Childhood   GASTRIC BYPASS  04/2008   NASAL SEPTUM SURGERY  1988   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   TONSILLECTOMY     1963   Patient Active Problem List   Diagnosis Date Noted   Laceration of finger of left hand 10/03/2021   Neck pain 01/26/2021   Mitral regurgitation 05/05/2020   Insomnia 05/05/2019   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 04/02/2019   CHF (congestive heart failure), NYHA class I, chronic, diastolic (HCC) 07/19/2017   Osteoporosis 04/06/2016   Chronic venous insufficiency 04/06/2016   H/O bariatric surgery 03/25/2012   Hearing loss 11/23/2011   Fuchs' corneal dystrophy 05/31/2011   Hypothyroidism 01/17/2007   Dyslipidemia 01/17/2007   Essential hypertension 01/17/2007     THERAPY DIAG:  Cervicalgia  Muscle weakness (generalized)    REFERRING DIAG: M54.2 (ICD-10-CM) - Neck pain     Rationale for Evaluation and Treatment: Rehabilitation   ONSET DATE: months off and on   SUBJECTIVE:                                                                                                                                                                                                           SUBJECTIVE STATEMENT: 12/21/2022 States she has sore places where she didn't have sore places, her arm is about the same. States that her low back is sore when she sits upright  but she feels better. States she did wake up this morning with pain.   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 States that 2 years ago she was diagnosed with stenosis. States she was having some tenderness behind her left ear. In the last 6 months she has been having left upper arm pain and aching in her left neck. Stats that is is currently doing better. Pain is only on the left side. States she takes ibuprofen at night which helps. Sometimes it is difficult to get comfortable at night.    Difficult to dress and get arm behind back. Right hand dominant   PERTINENT HISTORY:  Osteopenia, hs of sleep apnea, surgery on throat - to widen her throat (25 years ago), gastric bypass   PAIN:  Are you having pain? Yes: NPRS scale: 4/10 Pain location:left upper arm Pain description: twinge  Aggravating factors: movement of her arm,  Relieving factors: rest,    PRECAUTIONS: None   WEIGHT BEARING RESTRICTIONS: No   FALLS:  Has patient fallen in last 6 months? No   OCCUPATION: retired - likes to play with grand children   PLOF: Independent   PATIENT GOALS: to have less pain and be able ot manage her pain so she doesn't have to take NSAIDs     OBJECTIVE:    DIAGNOSTIC FINDINGS:  Xray 5/22 FINDINGS: Alignment within normal limits. Mild right neural foraminal stenosis at C5-C6. Moderate right neural foraminal stenosis at C6-C7. Mild left neural foraminal stenosis at C6-C7. Prevertebral soft tissues are normal in thickness. Visualized lung apices are clear.   IMPRESSION: Degenerative changes of the  cervical spine as discussed above.   PATIENT SURVEYS:  FOTO 57.5%    COGNITION: Overall cognitive status: Within functional limits for tasks assessed   SENSATION: WFL   POSTURE: rounded shoulders, forward head, posterior pelvic tilt, and flexed trunk    PALPATION: Tenderness to palpation along left UT and B pecs   CERVICAL ROM:    Active ROM A/PROM (deg) eval  Flexion Hinges at C6 50% limited   Extension 30  Right lateral flexion    Left lateral flexion    Right rotation 45*  Left rotation 50   (Blank rows = not tested) *pain in left side of neck                UE Measurements       Upper Extremity Right 12/19/2022 Left 12/19/2022    A/PROM MMT A/PROM MMT  Shoulder Flexion 140 4 120* 4-*  Shoulder Extension          Shoulder Abduction 120 4 105* 4-*  Shoulder Adduction          Shoulder Internal Rotation Reaches to T12 SP 3+ Reaches to T12 SP*- and wings 3+*  Shoulder External Rotation Reaches to C7SP 3+ Reaches to C7SP* 3+*  Elbow Flexion          Elbow Extension          Wrist Flexion          Wrist Extension          Wrist Supination          Wrist Pronation          Wrist Ulnar Deviation          Wrist Radial Deviation          Grip Strength NA   NA                          (  Blank rows = not tested)                       * pain - twinge     TODAY'S TREATMENT:                                                                                                                              DATE: 12/21/2022  Therapeutic Exercise:    Aerobic: Supine: Chin tuck x10 PT assist 5" Childs, self mobilization to lef QL in standing with tennis ball -8 minutes Prone:    Seated:    Standing: Neuromuscular Re-education: long exhale breathing 15 minutes - tactile/verbal cue, 15 minutes pelvic tilts tactile and verbal cues Manual Therapy: Therapeutic Activity: Self Care: Trigger Point Dry Needling:  Modalities:    PATIENT EDUCATION:  Education details: on HEP Person  educated: Patient Education method: Explanation, Media planner, and Handouts Education comprehension: verbalized understanding     HOME EXERCISE PROGRAM: [id number]   ASSESSMENT:   CLINICAL IMPRESSION: 12/21/2022 Session focused on additionally movement retraining and education on rationale for interventions. Discussed use of towel roll in lumbar spine to help with seated posture. Added long exhale and pelvic tilts to HEP which were tolerated well after practice with frequent verbal and tactile cues. Will continue with current POC as tolerated.  Eval: Patient presents to physical therapy with complaints of left cervical pain radiating into left upper arm.  Pain has been going on for a while but is episodic in nature.  Patient demonstrates limitations in posture, range of motion, and strength.  Patient would greatly benefit from skilled physical therapy to reduce further injury and improve overall quality of life.   OBJECTIVE IMPAIRMENTS: decreased activity tolerance, decreased ROM, decreased strength, improper body mechanics, and pain.    ACTIVITY LIMITATIONS: carrying, lifting, sleeping, bathing, dressing, reach over head, and locomotion level   PARTICIPATION LIMITATIONS: cleaning and community activity   PERSONAL FACTORS: Age, Fitness, and Time since onset of injury/illness/exacerbation are also affecting patient's functional outcome.    REHAB POTENTIAL: Good   CLINICAL DECISION MAKING: Stable/uncomplicated   EVALUATION COMPLEXITY: Low     GOALS: Goals reviewed with patient? yes   SHORT TERM GOALS: Target date: 01/09/2023  Patient will be independent in self management strategies to improve quality of life and functional outcomes. Baseline: New Program Goal status: INITIAL   2.  Patient will report at least 50% improvement in overall symptoms and/or function to demonstrate improved functional mobility Baseline: 0% better Goal status: INITIAL   3.  Patient will be ale  to demonstrate chin tuck and cervical flexion without hinging at C6/7 to demonstrate improved cervical ROM Baseline:  Goal status: INITIAL         LONG TERM GOALS: Target date: 01/30/2023    Patient will report at least 75% improvement in overall symptoms and/or function to demonstrate improved functional mobility Baseline: 0% better Goal status: INITIAL  2.  Patient will improve score on FOTO outcomes measure to projected score to demonstrate overall improved function and QOL Baseline: see above Goal status: INITIAL   3.  And patient will demonstrate pain-free left upper extremity range of motion Baseline: painful Goal status: INITIAL   4.  Patient will demonstrate at least 4/5 MMT in B UE  Baseline:  Goal status: INITIAL       PLAN:   PT FREQUENCY: 2x/week   PT DURATION: 6 weeks   PLANNED INTERVENTIONS: Therapeutic exercises, Therapeutic activity, Neuromuscular re-education, Balance training, Gait training, Patient/Family education, Self Care, Joint mobilization, Joint manipulation, Vestibular training, Canalith repositioning, Aquatic Therapy, Dry Needling, Electrical stimulation, Spinal manipulation, Spinal mobilization, Cryotherapy, Moist heat, Traction, Ultrasound, Parrafin, Ionotophoresis 4mg /ml Dexamethasone, Manual therapy, and Re-evaluation   PLAN FOR NEXT SESSION: Chin tucks, posture, core and minutes, upper cervical traction     11:18 AM, 12/21/22 Jerene Pitch, DPT Physical Therapy [facility]

## 2022-12-21 ENCOUNTER — Ambulatory Visit: Payer: Medicare Other | Admitting: Physical Therapy

## 2022-12-21 ENCOUNTER — Encounter: Payer: Self-pay | Admitting: Physical Therapy

## 2022-12-21 DIAGNOSIS — M6281 Muscle weakness (generalized): Secondary | ICD-10-CM

## 2022-12-21 DIAGNOSIS — M542 Cervicalgia: Secondary | ICD-10-CM

## 2022-12-25 ENCOUNTER — Ambulatory Visit: Payer: Medicare Other | Admitting: Physical Therapy

## 2022-12-25 ENCOUNTER — Encounter: Payer: Self-pay | Admitting: Physical Therapy

## 2022-12-25 DIAGNOSIS — M6281 Muscle weakness (generalized): Secondary | ICD-10-CM | POA: Diagnosis not present

## 2022-12-25 DIAGNOSIS — M542 Cervicalgia: Secondary | ICD-10-CM | POA: Diagnosis not present

## 2022-12-25 NOTE — Therapy (Signed)
OUTPATIENT PHYSICAL THERAPY TREATMENT NOTE   Patient Name: Jacqueline Hayes MRN: [id number] DOB:1956-03-07, 67 y.o., female Today's Date: 12/25/2022  PCP: Vivi Barrack, MD   REFERRING PROVIDER: Vivi Barrack, MD  END OF SESSION:   PT End of Session - 12/25/22 1101     Visit Number 3    Number of Visits 12    Date for PT Re-Evaluation 01/30/23    Authorization Type [employer] visits med [place]    PT Start Time 69    PT Stop Time 1144    PT Time Calculation (min) 42 min    Activity Tolerance Patient tolerated treatment well    Behavior During Therapy WFL for tasks assessed/performed             Past Medical History:  Diagnosis Date   Essential hypertension    History of kidney stones    Hypothyroidism    Mitral regurgitation    PVC's (premature ventricular contractions)    Past Surgical History:  Procedure Laterality Date   CATARACT EXTRACTION, BILATERAL Bilateral    FOOT SURGERY Left    Childhood   GASTRIC BYPASS  04/2008   NASAL SEPTUM SURGERY  1988   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   TONSILLECTOMY     1963   Patient Active Problem List   Diagnosis Date Noted   Laceration of finger of left hand 10/03/2021   Neck pain 01/26/2021   Mitral regurgitation 05/05/2020   Insomnia 05/05/2019   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 04/02/2019   CHF (congestive heart failure), NYHA class I, chronic, diastolic ([place]) 40/98/1191   Osteoporosis 04/06/2016   Chronic venous insufficiency 04/06/2016   H/O bariatric surgery 03/25/2012   Hearing loss 11/23/2011   Fuchs' corneal dystrophy 05/31/2011   Hypothyroidism 01/17/2007   Dyslipidemia 01/17/2007   Essential hypertension 01/17/2007     THERAPY DIAG:  Cervicalgia  Muscle weakness (generalized)    REFERRING DIAG: M54.2 (ICD-10-CM) - Neck pain     Rationale for Evaluation and Treatment: Rehabilitation   ONSET DATE: months off and on   SUBJECTIVE:                                                                                                                                                                                                           SUBJECTIVE STATEMENT: 12/25/2022 States she feels sore and when she does her exercises she gets relief. States she is starting to feel sore in other places where she hasn't felt it  before. Reports no current pain in arm   Eval: States her pain is off and on and gets worse/better. States that 2 years ago she was diagnosed with stenosis. States she was having some tenderness behind her left ear. In the last 6 months she has been having left upper arm pain and aching in her left neck. Stats that is is currently doing better. Pain is only on the left side. States she takes ibuprofen at night which helps. Sometimes it is difficult to get comfortable at night.    Difficult to dress and get arm behind back. Right hand dominant   PERTINENT HISTORY:  Osteopenia, hs of sleep apnea, surgery on throat - to widen her throat (25 years ago), gastric bypass   PAIN:  Are you having pain? Yes: NPRS scale: 2d/10 Pain location:generalized soreness  Pain description: twinge  Aggravating factors: movement of her arm,  Relieving factors: rest,    PRECAUTIONS: None   WEIGHT BEARING RESTRICTIONS: No   FALLS:  Has patient fallen in last 6 months? No   OCCUPATION: retired - likes to play with grand children   PLOF: Independent   PATIENT GOALS: to have less pain and be able ot manage her pain so she doesn't have to take NSAIDs     OBJECTIVE:    DIAGNOSTIC FINDINGS:  Xray 5/22 FINDINGS: Alignment within normal limits. Mild right neural foraminal stenosis at C5-C6. Moderate right neural foraminal stenosis at C6-C7. Mild left neural foraminal stenosis at C6-C7. Prevertebral soft tissues are normal in thickness. Visualized lung apices are clear.   IMPRESSION: Degenerative changes of the cervical spine as  discussed above.   PATIENT SURVEYS:  FOTO 57.5%    COGNITION: Overall cognitive status: Within functional limits for tasks assessed   SENSATION: WFL   POSTURE: rounded shoulders, forward head, posterior pelvic tilt, and flexed trunk    PALPATION: Tenderness to palpation along left UT and B pecs   CERVICAL ROM:    Active ROM A/PROM (deg) eval  Flexion Hinges at C6 50% limited   Extension 30  Right lateral flexion    Left lateral flexion    Right rotation 45*  Left rotation 50   (Blank rows = not tested) *pain in left side of neck                UE Measurements       Upper Extremity Right 12/19/2022 Left 12/19/2022    A/PROM MMT A/PROM MMT  Shoulder Flexion 140 4 120* 4-*  Shoulder Extension          Shoulder Abduction 120 4 105* 4-*  Shoulder Adduction          Shoulder Internal Rotation Reaches to T12 SP 3+ Reaches to T12 SP*- and wings 3+*  Shoulder External Rotation Reaches to C7SP 3+ Reaches to C7SP* 3+*  Elbow Flexion          Elbow Extension          Wrist Flexion          Wrist Extension          Wrist Supination          Wrist Pronation          Wrist Ulnar Deviation          Wrist Radial Deviation          Grip Strength NA   NA                          (  Blank rows = not tested)                       * pain - twinge     TODAY'S TREATMENT:                                                                                                                              DATE: 12/25/2022  Therapeutic Exercise:    Aerobic: Supine:   Prone:    Seated:    Standing:self mobilization with tennis ball 6 minutes Neuromuscular Re-education: long exhale breathing 10 minutes - tactile/verbal cue, belly breathing in supine and prone 15 minutes total chin tuck in prone Manual Therapy: Therapeutic Activity: Self Care: Trigger Point Dry Needling:  Modalities:    PATIENT EDUCATION:  Education details: on review of HEP, on postures, on rehab, on expected  outcomes Person educated: Patient Education method: Explanation, Demonstration, and Handouts Education comprehension: verbalized understanding     HOME EXERCISE PROGRAM: [id number]   ASSESSMENT:   CLINICAL IMPRESSION: 12/25/2022  Session focused on review of interventions and progression of breathing exercises. Tolerated prone well with chin tuck. Reduced tension noted with belly breathing after practice. Added new exercises to HEP. No pain noted during session. Will continue with current POC as toelrated.   Eval: Patient presents to physical therapy with complaints of left cervical pain radiating into left upper arm.  Pain has been going on for a while but is episodic in nature.  Patient demonstrates limitations in posture, range of motion, and strength.  Patient would greatly benefit from skilled physical therapy to reduce further injury and improve overall quality of life.   OBJECTIVE IMPAIRMENTS: decreased activity tolerance, decreased ROM, decreased strength, improper body mechanics, and pain.    ACTIVITY LIMITATIONS: carrying, lifting, sleeping, bathing, dressing, reach over head, and locomotion level   PARTICIPATION LIMITATIONS: cleaning and community activity   PERSONAL FACTORS: Age, Fitness, and Time since onset of injury/illness/exacerbation are also affecting patient's functional outcome.    REHAB POTENTIAL: Good   CLINICAL DECISION MAKING: Stable/uncomplicated   EVALUATION COMPLEXITY: Low     GOALS: Goals reviewed with patient? yes   SHORT TERM GOALS: Target date: 01/09/2023  Patient will be independent in self management strategies to improve quality of life and functional outcomes. Baseline: New Program Goal status: INITIAL   2.  Patient will report at least 50% improvement in overall symptoms and/or function to demonstrate improved functional mobility Baseline: 0% better Goal status: INITIAL   3.  Patient will be ale to demonstrate chin tuck and  cervical flexion without hinging at C6/7 to demonstrate improved cervical ROM Baseline:  Goal status: INITIAL         LONG TERM GOALS: Target date: 01/30/2023    Patient will report at least 75% improvement in overall symptoms and/or function to demonstrate improved functional mobility Baseline: 0% better Goal status: INITIAL   2.  Patient will improve  score on FOTO outcomes measure to projected score to demonstrate overall improved function and QOL Baseline: see above Goal status: INITIAL   3.  And patient will demonstrate pain-free left upper extremity range of motion Baseline: painful Goal status: INITIAL   4.  Patient will demonstrate at least 4/5 MMT in B UE  Baseline:  Goal status: INITIAL       PLAN:   PT FREQUENCY: 2x/week   PT DURATION: 6 weeks   PLANNED INTERVENTIONS: Therapeutic exercises, Therapeutic activity, Neuromuscular re-education, Balance training, Gait training, Patient/Family education, Self Care, Joint mobilization, Joint manipulation, Vestibular training, Canalith repositioning, Aquatic Therapy, Dry Needling, Electrical stimulation, Spinal manipulation, Spinal mobilization, Cryotherapy, Moist heat, Traction, Ultrasound, Parrafin, Ionotophoresis 4mg /ml Dexamethasone, Manual therapy, and Re-evaluation   PLAN FOR NEXT SESSION: Chin tucks, posture, core and minutes, upper cervical traction     1:06 PM, 12/25/22 Jerene Pitch, DPT Physical Therapy with [facility]

## 2022-12-27 ENCOUNTER — Encounter: Payer: Self-pay | Admitting: Physical Therapy

## 2022-12-27 ENCOUNTER — Ambulatory Visit: Payer: Medicare Other | Admitting: Physical Therapy

## 2022-12-27 DIAGNOSIS — M6281 Muscle weakness (generalized): Secondary | ICD-10-CM

## 2022-12-27 DIAGNOSIS — M542 Cervicalgia: Secondary | ICD-10-CM | POA: Diagnosis not present

## 2022-12-27 NOTE — Therapy (Signed)
OUTPATIENT PHYSICAL THERAPY TREATMENT NOTE   Patient Name: Jacqueline Hayes MRN: [id number] DOB:[birth year], 67 y.o., female Today's Date: 12/27/2022  PCP: Vivi Barrack, MD   REFERRING PROVIDER: Vivi Barrack, MD  END OF SESSION:   PT End of Session - 12/27/22 1303     Visit Number 4    Number of Visits 12    Date for PT Re-Evaluation 01/30/23    Authorization Type [employer] visits med [place]    PT Start Time 1303    PT Stop Time 1341    PT Time Calculation (min) 38 min    Activity Tolerance Patient tolerated treatment well    Behavior During Therapy [facility] for tasks assessed/performed             Past Medical History:  Diagnosis Date   Essential hypertension    History of kidney stones    Hypothyroidism    Mitral regurgitation    PVC's (premature ventricular contractions)    Past Surgical History:  Procedure Laterality Date   CATARACT EXTRACTION, BILATERAL Bilateral    FOOT SURGERY Left    Childhood   GASTRIC BYPASS  04/2008   NASAL SEPTUM SURGERY  1988   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   TONSILLECTOMY     1963   Patient Active Problem List   Diagnosis Date Noted   Laceration of finger of left hand 10/03/2021   Neck pain 01/26/2021   Mitral regurgitation 05/05/2020   Insomnia 05/05/2019   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 04/02/2019   CHF (congestive heart failure), NYHA class I, chronic, diastolic ([place]) 32/67/1245   Osteoporosis 04/06/2016   Chronic venous insufficiency 04/06/2016   H/O bariatric surgery 03/25/2012   Hearing loss 11/23/2011   Fuchs' corneal dystrophy 05/31/2011   Hypothyroidism 01/17/2007   Dyslipidemia 01/17/2007   Essential hypertension 01/17/2007     THERAPY DIAG:  Cervicalgia  Muscle weakness (generalized)    REFERRING DIAG: M54.2 (ICD-10-CM) - Neck pain     Rationale for Evaluation and Treatment: Rehabilitation   ONSET DATE: months off and on   SUBJECTIVE:                                                                                                                                                                                                           SUBJECTIVE STATEMENT: 12/27/2022 States she was really sore after last session and she went home and did more exercises and yesterday she didn't do any. States she has almost no pain  in her arm and neck. States that pain is 100% better and she is sleeping at night.  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 States that 2 years ago she was diagnosed with stenosis. States she was having some tenderness behind her left ear. In the last 6 months she has been having left upper arm pain and aching in her left neck. Stats that is is currently doing better. Pain is only on the left side. States she takes ibuprofen at night which helps. Sometimes it is difficult to get comfortable at night.    Difficult to dress and get arm behind back. Right hand dominant   PERTINENT HISTORY:  Osteopenia, hs of sleep apnea, surgery on throat - to widen her throat (25 years ago), gastric bypass   PAIN:  Are you having pain? no: NPRS scale: 0/10 Pain location:generalized soreness  Pain description: twinge  Aggravating factors: movement of her arm,  Relieving factors: rest,    PRECAUTIONS: None   WEIGHT BEARING RESTRICTIONS: No   FALLS:  Has patient fallen in last 6 months? No   OCCUPATION: retired - likes to play with grand children   PLOF: Independent   PATIENT GOALS: to have less pain and be able ot manage her pain so she doesn't have to take NSAIDs     OBJECTIVE:    DIAGNOSTIC FINDINGS:  Xray 5/22 FINDINGS: Alignment within normal limits. Mild right neural foraminal stenosis at C5-C6. Moderate right neural foraminal stenosis at C6-C7. Mild left neural foraminal stenosis at C6-C7. Prevertebral soft tissues are normal in thickness. Visualized lung apices are clear.    IMPRESSION: Degenerative changes of the cervical spine as discussed above.   PATIENT SURVEYS:  FOTO 87%    COGNITION: Overall cognitive status: Within functional limits for tasks assessed   SENSATION: WFL   POSTURE: rounded shoulders, forward head, posterior pelvic tilt, and flexed trunk    PALPATION: Tenderness to palpation along left UT and B pecs   CERVICAL ROM:    Active ROM A/PROM (deg) 12/27/22  Flexion Hinges at C6 50% limited   Extension 30  Right lateral flexion    Left lateral flexion    Right rotation 55  Left rotation 60   (Blank rows = not tested) *pain in left side of neck                UE Measurements       Upper Extremity Right 12/27/22 Left 12/27/22    A/PROM MMT A/PROM MMT  Shoulder Flexion WFL 4 WFL 4  Shoulder Extension          Shoulder Abduction WFL 4 WFL 4  Shoulder Adduction          Shoulder Internal Rotation Reaches to T12 SP 4 Reaches to T12 SP 4  Shoulder External Rotation Reaches to C7SP 4 Reaches to C7SP 4  Elbow Flexion          Elbow Extension          Wrist Flexion          Wrist Extension          Wrist Supination          Wrist Pronation          Wrist Ulnar Deviation          Wrist Radial Deviation          Grip Strength NA   NA                          (  Blank rows = not tested)                       * pain - twinge     TODAY'S TREATMENT:                                                                                                                              DATE: 12/27/2022  Therapeutic Exercise:    Aerobic: Supine:   Prone:    Seated:    Standing:s  Neuromuscular Re-education:   Manual Therapy: Therapeutic Activity: Self Care: Trigger Point Dry Needling:  Modalities:    PATIENT EDUCATION:  Education details: on review of HEP, on postures, on rehab, on expected outcomes, on goals, MMT/ROM, on progress made, on POC and plan moving forward Person educated: Patient Education method: Explanation, Demonstration,  and Handouts Education comprehension: verbalized understanding     HOME EXERCISE PROGRAM: [id number]   ASSESSMENT:   CLINICAL IMPRESSION: 12/27/2022  Patient has met all but one goal at this time. Reviewed entire HEP and answered all questions and discussed plan moving forward. Reducing visits to 1x/4 weeks so patient can continue to focus on exercises. No pain noted end of session.    Eval: Patient presents to physical therapy with complaints of left cervical pain radiating into left upper arm.  Pain has been going on for a while but is episodic in nature.  Patient demonstrates limitations in posture, range of motion, and strength.  Patient would greatly benefit from skilled physical therapy to reduce further injury and improve overall quality of life.   OBJECTIVE IMPAIRMENTS: decreased activity tolerance, decreased ROM, decreased strength, improper body mechanics, and pain.    ACTIVITY LIMITATIONS: carrying, lifting, sleeping, bathing, dressing, reach over head, and locomotion level   PARTICIPATION LIMITATIONS: cleaning and community activity   PERSONAL FACTORS: Age, Fitness, and Time since onset of injury/illness/exacerbation are also affecting patient's functional outcome.    REHAB POTENTIAL: Good   CLINICAL DECISION MAKING: Stable/uncomplicated   EVALUATION COMPLEXITY: Low     GOALS: Goals reviewed with patient? yes   SHORT TERM GOALS: Target date: 01/09/2023  Patient will be independent in self management strategies to improve quality of life and functional outcomes. Baseline: New Program Goal status: PROGRESSING   2.  Patient will report at least 50% improvement in overall symptoms and/or function to demonstrate improved functional mobility Baseline: 0% better Goal status: MET   3.  Patient will be ale to demonstrate chin tuck and cervical flexion without hinging at C6/7 to demonstrate improved cervical ROM Baseline:  Goal status: MET         LONG TERM  GOALS: Target date: 01/30/2023    Patient will report at least 75% improvement in overall symptoms and/or function to demonstrate improved functional mobility Baseline: 0% better Goal status: MET   2.  Patient will improve score on FOTO outcomes measure to projected score to demonstrate overall improved  function and QOL Baseline: see above Goal status: MET   3.  And patient will demonstrate pain-free left upper extremity range of motion Baseline: painful Goal status: MET   4.  Patient will demonstrate at least 4/5 MMT in B UE  Baseline:  Goal status: MET       PLAN:   PT FREQUENCY: 2x/week   PT DURATION: 6 weeks   PLANNED INTERVENTIONS: Therapeutic exercises, Therapeutic activity, Neuromuscular re-education, Balance training, Gait training, Patient/Family education, Self Care, Joint mobilization, Joint manipulation, Vestibular training, Canalith repositioning, Aquatic Therapy, Dry Needling, Electrical stimulation, Spinal manipulation, Spinal mobilization, Cryotherapy, Moist heat, Traction, Ultrasound, Parrafin, Ionotophoresis 4mg /ml Dexamethasone, Manual therapy, and Re-evaluation   PLAN FOR NEXT SESSION: Chin tucks, posture, core and minutes, upper cervical traction     1:45 PM, 12/27/22 02/25/23, DPT Physical Therapy with [place]

## 2023-01-01 ENCOUNTER — Encounter: Payer: Medicare Other | Admitting: Physical Therapy

## 2023-01-03 ENCOUNTER — Encounter: Payer: Medicare Other | Admitting: Physical Therapy

## 2023-01-08 ENCOUNTER — Encounter: Payer: Medicare Other | Admitting: Physical Therapy

## 2023-01-10 ENCOUNTER — Encounter: Payer: Medicare Other | Admitting: Physical Therapy

## 2023-01-15 ENCOUNTER — Encounter: Payer: Medicare Other | Admitting: Physical Therapy

## 2023-01-17 ENCOUNTER — Encounter: Payer: Medicare Other | Admitting: Physical Therapy

## 2023-01-24 ENCOUNTER — Encounter: Payer: Self-pay | Admitting: Physical Therapy

## 2023-01-24 ENCOUNTER — Ambulatory Visit: Payer: Medicare Other | Admitting: Physical Therapy

## 2023-01-24 DIAGNOSIS — M542 Cervicalgia: Secondary | ICD-10-CM | POA: Diagnosis not present

## 2023-01-24 DIAGNOSIS — M6281 Muscle weakness (generalized): Secondary | ICD-10-CM | POA: Diagnosis not present

## 2023-01-24 NOTE — Therapy (Signed)
OUTPATIENT PHYSICAL THERAPY TREATMENT NOTE  PHYSICAL THERAPY DISCHARGE SUMMARY  Visits from Start of Care: 5  Current functional level related to goals / functional outcomes: All goals met   Remaining deficits: Strength   Education / Equipment: See below  Patient agrees to discharge. Patient goals were met. Patient is being discharged due to being pleased with the current functional level.  Patient Name: Jacqueline Hayes MRN: [id number] DOB:[birth year], 67 y.o., female Today's Date: 01/24/2023  PCP: Vivi Barrack, MD   REFERRING PROVIDER: Vivi Barrack, MD  END OF SESSION:   PT End of Session - 01/24/23 1024     Visit Number 5    Number of Visits 12    Date for PT Re-Evaluation 01/30/23    Authorization Type [employer] visits med [employer]    PT Start Time [id number]    PT Stop Time 1102    PT Time Calculation (min) 39 min    Activity Tolerance Patient tolerated treatment well    Behavior During Therapy WFL for tasks assessed/performed             Past Medical History:  Diagnosis Date   Essential hypertension    History of kidney stones    Hypothyroidism    Mitral regurgitation    PVC's (premature ventricular contractions)    Past Surgical History:  Procedure Laterality Date   CATARACT EXTRACTION, BILATERAL Bilateral    FOOT SURGERY Left    Childhood   GASTRIC BYPASS  04/2008   NASAL SEPTUM SURGERY  1988   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   TONSILLECTOMY     1963   Patient Active Problem List   Diagnosis Date Noted   Laceration of finger of left hand 10/03/2021   Neck pain 01/26/2021   Mitral regurgitation 05/05/2020   Insomnia 05/05/2019   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 04/02/2019   CHF (congestive heart failure), NYHA class I, chronic, diastolic ([place]) 0000000   Osteoporosis 04/06/2016   Chronic venous insufficiency 04/06/2016   H/O bariatric surgery 03/25/2012   Hearing loss 11/23/2011    Fuchs' corneal dystrophy 05/31/2011   Hypothyroidism 01/17/2007   Dyslipidemia 01/17/2007   Essential hypertension 01/17/2007     THERAPY DIAG:  Cervicalgia  Muscle weakness (generalized)    REFERRING DIAG: M54.2 (ICD-10-CM) - Neck pain     Rationale for Evaluation and Treatment: Rehabilitation   ONSET DATE: months off and on   SUBJECTIVE:  SUBJECTIVE STATEMENT: 01/24/2023 States she is not have constant pain and doing exercises every day without difficulties.   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 States that 2 years ago she was diagnosed with stenosis. States she was having some tenderness behind her left ear. In the last 6 months she has been having left upper arm pain and aching in her left neck. Stats that is is currently doing better. Pain is only on the left side. States she takes ibuprofen at night which helps. Sometimes it is difficult to get comfortable at night.    Difficult to dress and get arm behind back. Right hand dominant   PERTINENT HISTORY:  Osteopenia, hs of sleep apnea, surgery on throat - to widen her throat (25 years ago), gastric bypass   PAIN:  Are you having pain? no: NPRS scale: 0/10 Pain location:generalized soreness  Pain description: twinge  Aggravating factors: movement of her arm,  Relieving factors: rest,    PRECAUTIONS: None   WEIGHT BEARING RESTRICTIONS: No   FALLS:  Has patient fallen in last 6 months? No   OCCUPATION: retired - likes to play with grand children   PLOF: Independent   PATIENT GOALS: to have less pain and be able ot manage her pain so she doesn't have to take NSAIDs     OBJECTIVE:    DIAGNOSTIC FINDINGS:  Xray 5/22 FINDINGS: Alignment within normal limits. Mild right neural foraminal  stenosis at C5-C6. Moderate right neural foraminal stenosis at C6-C7. Mild left neural foraminal stenosis at C6-C7. Prevertebral soft tissues are normal in thickness. Visualized lung apices are clear.   IMPRESSION: Degenerative changes of the cervical spine as discussed above.   PATIENT SURVEYS:  FOTO 87%    COGNITION: Overall cognitive status: Within functional limits for tasks assessed   SENSATION: WFL   POSTURE: rounded shoulders, forward head, posterior pelvic tilt, and flexed trunk    PALPATION: Tenderness to palpation along left UT and B pecs   CERVICAL ROM:    Active ROM A/PROM (deg) 12/27/22  Flexion Hinges at C6 50% limited   Extension 30  Right lateral flexion    Left lateral flexion    Right rotation 55  Left rotation 60   (Blank rows = not tested) *pain in left side of neck                UE Measurements       Upper Extremity Right 12/27/22 Left 12/27/22    A/PROM MMT A/PROM MMT  Shoulder Flexion WFL 4 WFL 4  Shoulder Extension          Shoulder Abduction WFL 4 WFL 4  Shoulder Adduction          Shoulder Internal Rotation Reaches to T12 SP 4 Reaches to T12 SP 4  Shoulder External Rotation Reaches to C7SP 4 Reaches to C7SP 4  Elbow Flexion          Elbow Extension          Wrist Flexion          Wrist Extension          Wrist Supination          Wrist Pronation          Wrist Ulnar Deviation          Wrist Radial Deviation          Grip Strength NA   NA                          (  Blank rows = not tested)                       * pain - twinge     TODAY'S TREATMENT:                                                                                                                              DATE: 01/24/2023  Therapeutic Exercise:    Aerobic: Supine:  scapular protraction 10 minutes with breathing - tolerated well  Prone:    Seated:    Standing: tried scapular protraction - difficult in standing/seated stopped, shoulder flexion up wall with pillow  case 3x5 Neuromuscular Re-education:  belly breathing and chest breathing 20 minutes with tactile and verbal cues Manual Therapy: Therapeutic Activity: Self Care: Trigger Point Dry Needling:  Modalities:    PATIENT EDUCATION:  Education details: on safe return to the gym - on what to do with intensity- lower weights higher reps and working different machines into routine. Person educated: Patient Education method: Explanation, Demonstration, and Handouts Education comprehension: verbalized understanding     HOME EXERCISE PROGRAM: [place]   ASSESSMENT:   CLINICAL IMPRESSION: 01/24/2023 All goals met at this time.  Reviewed home exercise program and breathing exercises.  Added a couple extra shoulder exercises to home program which were also tolerated well.  Patient to discharge from physical therapy at this time secondary to progress made and independence in program.   Eval: Patient presents to physical therapy with complaints of left cervical pain radiating into left upper arm.  Pain has been going on for a while but is episodic in nature.  Patient demonstrates limitations in posture, range of motion, and strength.  Patient would greatly benefit from skilled physical therapy to reduce further injury and improve overall quality of life.   OBJECTIVE IMPAIRMENTS: decreased activity tolerance, decreased ROM, decreased strength, improper body mechanics, and pain.    ACTIVITY LIMITATIONS: carrying, lifting, sleeping, bathing, dressing, reach over head, and locomotion level   PARTICIPATION LIMITATIONS: cleaning and community activity   PERSONAL FACTORS: Age, Fitness, and Time since onset of injury/illness/exacerbation are also affecting patient's functional outcome.    REHAB POTENTIAL: Good   CLINICAL DECISION MAKING: Stable/uncomplicated   EVALUATION COMPLEXITY: Low     GOALS: Goals reviewed with patient? yes   SHORT TERM GOALS: Target date: 01/09/2023  Patient will be  independent in self management strategies to improve quality of life and functional outcomes. Baseline: New Program Goal status: MET   2.  Patient will report at least 50% improvement in overall symptoms and/or function to demonstrate improved functional mobility Baseline: 0% better Goal status: MET   3.  Patient will be ale to demonstrate chin tuck and cervical flexion without hinging at C6/7 to demonstrate improved cervical ROM Baseline:  Goal status: MET         LONG TERM GOALS: Target date: 01/30/2023    Patient will report  at least 75% improvement in overall symptoms and/or function to demonstrate improved functional mobility Baseline: 0% better Goal status: MET   2.  Patient will improve score on FOTO outcomes measure to projected score to demonstrate overall improved function and QOL Baseline: see above Goal status: MET   3.  And patient will demonstrate pain-free left upper extremity range of motion Baseline: painful Goal status: MET   4.  Patient will demonstrate at least 4/5 MMT in B UE  Baseline:  Goal status: MET       PLAN:   PT FREQUENCY: 2x/week   PT DURATION: 6 weeks   PLANNED INTERVENTIONS: Therapeutic exercises, Therapeutic activity, Neuromuscular re-education, Balance training, Gait training, Patient/Family education, Self Care, Joint mobilization, Joint manipulation, Vestibular training, Canalith repositioning, Aquatic Therapy, Dry Needling, Electrical stimulation, Spinal manipulation, Spinal mobilization, Cryotherapy, Moist heat, Traction, Ultrasound, Parrafin, Ionotophoresis '4mg'$ /ml Dexamethasone, Manual therapy, and Re-evaluation   PLAN FOR NEXT SESSION: Chin tucks, posture, core and minutes, upper cervical traction     10:25 AM, 01/24/23 Jerene Pitch, DPT Physical Therapy with [facility]

## 2023-01-25 ENCOUNTER — Other Ambulatory Visit: Payer: Self-pay | Admitting: Cardiology

## 2023-02-21 ENCOUNTER — Other Ambulatory Visit: Payer: Self-pay | Admitting: Family Medicine

## 2023-02-21 DIAGNOSIS — Z1231 Encounter for screening mammogram for malignant neoplasm of breast: Secondary | ICD-10-CM

## 2023-02-27 ENCOUNTER — Other Ambulatory Visit: Payer: Self-pay | Admitting: Family Medicine

## 2023-02-27 DIAGNOSIS — E039 Hypothyroidism, unspecified: Secondary | ICD-10-CM

## 2023-02-27 DIAGNOSIS — I5032 Chronic diastolic (congestive) heart failure: Secondary | ICD-10-CM

## 2023-03-05 ENCOUNTER — Telehealth: Payer: Self-pay | Admitting: Family Medicine

## 2023-03-05 NOTE — Telephone Encounter (Signed)
GOING OUT OF TOWN AND NEEDS MEDS ASAP. WAS TOLD BY PHARMACY THAT THEY HAD SENT [place] THE REQUEST BUT I DO NOT SEE IT IN OUR SYSTEM.     Prescription Request  03/05/2023  LOV: 12/18/2022  What is the name of the medication or equipment?  potassium chloride (KLOR-CON) 10 MEQ tablet   Have you contacted your pharmacy to request a refill? Yes   Which pharmacy would you like this sent to?  [facility] [address], [place] - [address] [place] [address] Phone: [phone] Fax: [phone]    Patient notified that their request is being sent to the clinical staff for review and that they should receive a response within 2 business days.   Please advise at [facility] [phone]

## 2023-03-06 ENCOUNTER — Other Ambulatory Visit: Payer: Self-pay

## 2023-03-06 MED ORDER — POTASSIUM CHLORIDE ER 10 MEQ PO TBCR: EXTENDED_RELEASE_TABLET | ORAL | 3 refills | Status: DC

## 2023-03-06 NOTE — Telephone Encounter (Signed)
Refill sent to pharmacy.   

## 2023-05-31 ENCOUNTER — Telehealth: Payer: Self-pay | Admitting: Family Medicine

## 2023-05-31 ENCOUNTER — Other Ambulatory Visit: Payer: Self-pay

## 2023-05-31 DIAGNOSIS — E7800 Pure hypercholesterolemia, unspecified: Secondary | ICD-10-CM

## 2023-05-31 MED ORDER — ROSUVASTATIN CALCIUM 10 MG PO TABS: 10.0000 mg | ORAL_TABLET | ORAL | 3 refills | Status: DC

## 2023-05-31 NOTE — Telephone Encounter (Signed)
.  Prescription Request  05/31/2023  LOV: 12/18/2022  What is the name of the medication or equipment? rosuvastatin (CRESTOR) 10 MG tablet   Patient stated PCP informed her he would fill all meds that were prescribed by former PCP, Dr. Leveda Anna. She would like 90 day supply.  Have you contacted your pharmacy to request a refill? Yes   Which pharmacy would you like this sent to?  [facility] [address], [place] - [address] [place] [address] Phone: [phone] Fax: [phone]    Patient notified that their request is being sent to the clinical staff for review and that they should receive a response within 2 business days.   Please advise at Mobile [phone] (mobile)

## 2023-05-31 NOTE — Telephone Encounter (Signed)
Ok with me. Please place any necessary orders. 

## 2023-05-31 NOTE — Telephone Encounter (Signed)
Rx has been sent in,  called pt and pt is aware.

## 2023-06-09 ENCOUNTER — Other Ambulatory Visit: Payer: Self-pay | Admitting: Family Medicine

## 2023-06-09 DIAGNOSIS — E039 Hypothyroidism, unspecified: Secondary | ICD-10-CM

## 2023-06-09 DIAGNOSIS — I5032 Chronic diastolic (congestive) heart failure: Secondary | ICD-10-CM

## 2023-07-05 ENCOUNTER — Encounter (INDEPENDENT_AMBULATORY_CARE_PROVIDER_SITE_OTHER): Payer: Self-pay

## 2023-07-30 ENCOUNTER — Ambulatory Visit (INDEPENDENT_AMBULATORY_CARE_PROVIDER_SITE_OTHER): Payer: Medicare Other | Admitting: Family Medicine

## 2023-07-30 ENCOUNTER — Encounter: Payer: Self-pay | Admitting: Family Medicine

## 2023-07-30 VITALS — BP 110/73 | HR 58 | Temp 97.7°F | Ht 64.0 in | Wt 196.6 lb

## 2023-07-30 DIAGNOSIS — E039 Hypothyroidism, unspecified: Secondary | ICD-10-CM

## 2023-07-30 DIAGNOSIS — I10 Essential (primary) hypertension: Secondary | ICD-10-CM | POA: Diagnosis not present

## 2023-07-30 DIAGNOSIS — Z0000 Encounter for general adult medical examination without abnormal findings: Secondary | ICD-10-CM | POA: Diagnosis not present

## 2023-07-30 DIAGNOSIS — R21 Rash and other nonspecific skin eruption: Secondary | ICD-10-CM | POA: Diagnosis not present

## 2023-07-30 DIAGNOSIS — L301 Dyshidrosis [pompholyx]: Secondary | ICD-10-CM | POA: Insufficient documentation

## 2023-07-30 DIAGNOSIS — E785 Hyperlipidemia, unspecified: Secondary | ICD-10-CM

## 2023-07-30 DIAGNOSIS — M542 Cervicalgia: Secondary | ICD-10-CM

## 2023-07-30 DIAGNOSIS — M810 Age-related osteoporosis without current pathological fracture: Secondary | ICD-10-CM

## 2023-07-30 DIAGNOSIS — G4700 Insomnia, unspecified: Secondary | ICD-10-CM

## 2023-07-30 LAB — COMPREHENSIVE METABOLIC PANEL
ALT: 15 U/L (ref 0–35)
AST: 19 U/L (ref 0–37)
Albumin: 4 g/dL (ref 3.5–5.2)
Alkaline Phosphatase: 78 U/L (ref 39–117)
BUN: 20 mg/dL (ref 6–23)
CO2: 31 meq/L (ref 19–32)
Calcium: 9.3 mg/dL (ref 8.4–10.5)
Chloride: 102 meq/L (ref 96–112)
Creatinine, Ser: 0.68 mg/dL (ref 0.40–1.20)
GFR: 89.28 mL/min (ref >60.00)
Glucose, Bld: 95 mg/dL (ref 70–99)
Potassium: 3.5 meq/L (ref 3.5–5.1)
Sodium: 140 meq/L (ref 135–145)
Total Bilirubin: 0.4 mg/dL (ref 0.2–1.2)
Total Protein: 6.7 g/dL (ref 6.0–8.3)

## 2023-07-30 LAB — CBC
HCT: 38.3 % (ref 36.0–46.0)
Hemoglobin: 12.4 g/dL (ref 12.0–15.0)
MCHC: 32.5 g/dL (ref 30.0–36.0)
MCV: 93.9 fl (ref 78.0–100.0)
Platelets: 214 10*3/uL (ref 150.0–400.0)
RBC: 4.08 Mil/uL (ref 3.87–5.11)
RDW: 14.3 % (ref 11.5–15.5)
WBC: 3.9 10*3/uL — ABNORMAL LOW (ref 4.0–10.5)

## 2023-07-30 LAB — HEMOGLOBIN A1C: Hgb A1c MFr Bld: 5.5 % (ref 4.6–6.5)

## 2023-07-30 LAB — LIPID PANEL
Cholesterol: 166 mg/dL (ref 0–200)
HDL: 73.6 mg/dL (ref >39.00)
LDL Cholesterol: 61 mg/dL (ref 0–99)
NonHDL: 92.26
Total CHOL/HDL Ratio: 2
Triglycerides: 154 mg/dL — ABNORMAL HIGH (ref 0.0–149.0)
VLDL: 30.8 mg/dL (ref 0.0–40.0)

## 2023-07-30 LAB — TSH: TSH: 3.48 u[IU]/mL (ref 0.35–5.50)

## 2023-07-30 MED ORDER — LORAZEPAM 1 MG PO TABS: 1.0000 mg | ORAL_TABLET | Freq: Every evening | ORAL | 5 refills | Status: DC | PRN

## 2023-07-30 MED ORDER — KETOCONAZOLE 2 % EX CREA: 1.0000 | TOPICAL_CREAM | Freq: Two times a day (BID) | CUTANEOUS | 0 refills | Status: DC

## 2023-07-30 NOTE — Progress Notes (Signed)
Chief Complaint:  Jacqueline Hayes is a 67 y.o. female who presents today for her annual comprehensive physical exam.    Assessment/Plan:  New/Acute Problems: Rash Clinically consistent with dyshidrosis however she did not respond to betamethasone that was prescribed by her dermatologist.  We will try ketoconazole to treat any potential underlying fungal infection.  If not improving she will need to follow back up with dermatology.  Chronic Problems Addressed Today: Hypothyroidism On Synthroid 112 mcg daily.  Check TSH today.  Dyslipidemia On Crestor 10 mg daily.  Check labs.  Essential hypertension Blood pressure at goal today on losartan 100 mg daily and hydralazine 25 mg 3 times daily.  Neck pain Improved with physical therapy.  She will let us know if this continues to be an issue and we could refer to sports medicine orthopedics.  Insomnia Uses lorazepam 1 mg  nightly as needed very sparingly.  She tries to use only as needed.  Will refill today.  Last refill was over a year ago.  Tolerates well without any significant side effects.  Database was reviewed today without red flags.  Osteoporosis Will repeat bone density scan later this year.  Preventative Healthcare: Check labs.  Up-to-date on mammogram.  Due for colonoscopy in 2 years.  Will check bone density scan later this year as well.  Patient Counseling(The following topics were reviewed and/or handout was given):  -Nutrition: Stressed importance of moderation in sodium/caffeine intake, saturated fat and cholesterol, caloric balance, sufficient intake of fresh fruits, vegetables, and fiber.  -Stressed the importance of regular exercise.   -Substance Abuse: Discussed cessation/primary prevention of tobacco, alcohol, or other drug use; driving or other dangerous activities under the influence; availability of treatment for abuse.   -Injury prevention: Discussed safety belts, safety helmets, smoke detector, smoking near  bedding or upholstery.   -Sexuality: Discussed sexually transmitted diseases, partner selection, use of condoms, avoidance of unintended pregnancy and contraceptive alternatives.   -Dental health: Discussed importance of regular tooth brushing, flossing, and dental visits.  -Health maintenance and immunizations reviewed. Please refer to Health maintenance section.  Return to care in 1 year for next preventative visit.     Subjective:  HPI:  She has no acute complaints today.  See A/P for status of chronic conditions.  Patient is here today for annual physical.  We saw her several months ago for neck pain.  Refer her to PT.  Symptoms have improved significantly.  She also has some irritation on the left thumb.  Saw dermatology for this several months ago.  Tried betamethasone without much improvement.  Lifestyle Diet: Balanced. Plenty of fruits and vegetables.  Exercise: Goes to gym occasionally.      07/30/2023   10:10 AM  Depression screen PHQ 2/9  Decreased Interest 0  Down, Depressed, Hopeless 0  PHQ - 2 Score 0   Health Maintenance Due  Topic Date Due   DEXA SCAN  09/08/2022     ROS: Per HPI, otherwise a complete review of systems was negative.   PMH:  The following were reviewed and entered/updated in epic: Past Medical History:  Diagnosis Date   Essential hypertension    History of kidney stones    Hypothyroidism    Mitral regurgitation    PVC's (premature ventricular contractions)    Patient Active Problem List   Diagnosis Date Noted   Dyshydrosis 07/30/2023   Neck pain 01/26/2021   Mitral regurgitation 05/05/2020   Insomnia 05/05/2019   Myalgia due to HMG  CoA reductase inhibitor 04/02/2019   CHF (congestive heart failure), NYHA class I, chronic, diastolic ([facility]) 07/19/2017   Osteoporosis 04/06/2016   Chronic venous insufficiency 04/06/2016   H/O bariatric surgery 03/25/2012   Hearing loss 11/23/2011   Fuchs' corneal dystrophy 05/31/2011   Hypothyroidism  01/17/2007   Dyslipidemia 01/17/2007   Essential hypertension 01/17/2007   Past Surgical History:  Procedure Laterality Date   CATARACT EXTRACTION, BILATERAL Bilateral    FOOT SURGERY Left    Childhood   GASTRIC BYPASS  04/2008   NASAL SEPTUM SURGERY  1988   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      calcium carbonate (OSCAL) 1500 (600 Ca) MG TABS tablet Take 1 tablet by mouth. Calcium 1200 mg every other day.     fluticasone (FLONASE) 50 MCG/ACT nasal spray Place into the nose.     furosemide (LASIX) 20 MG tablet Take 1 tablet by mouth once daily 90 tablet 0   hydrALAZINE (APRESOLINE) 25 MG tablet TAKE 1 TABLET BY MOUTH THREE TIMES DAILY 270 tablet 2   ketoconazole (NIZORAL) 2 % cream Apply 1 Application topically 2 (two) times daily. 60 g 0   levothyroxine (SYNTHROID) 125 MCG tablet TAKE 1 TABLET BY MOUTH ONCE DAILY BEFORE BREAKFAST 90 tablet 0   LORazepam (ATIVAN) 1 MG tablet Take 1 tablet (1 mg total) by mouth at bedtime as needed for anxiety. 30 tablet 5   losartan (COZAAR) 100 MG tablet Take 1 tablet (100 mg total) by mouth daily. 90 tablet 3   Magnesium 400 MG CAPS Take 250 mg by mouth 3 (three) times daily.      Multiple Vitamins-Minerals (ZINC PO) Take 10 mg by mouth.     potassium chloride (KLOR-CON) 10 MEQ tablet TAKE 1 & 1/2 (ONE & ONE-HALF) TABLETS BY MOUTH ONCE DAILY 135 tablet 3   rosuvastatin  (CRESTOR) 10 MG tablet Take 1 tablet (10 mg total) by mouth every other day. 45 tablet 3   vitamin B-12 (CYANOCOBALAMIN) 1000 MCG tablet Place 1,000 mcg under the tongue daily.     No current facility-administered medications for this visit.    Allergies-reviewed and updated Allergies  Allergen Reactions   Amlodipine     Leg swelling   Atenolol     bradycardia   Catapres [Clonidine Hydrochloride]     Blood pressure dropped too low after 1st dose   Fosamax [Alendronate Sodium]     Jaw pain.  Aching joints.   Lisinopril     REACTION: ? ACE cough in the past?   Macrobid [Nitrofurantoin Monohydrate Macrocrystals]     Causes elevation of liver enzymes   Nitrofurantoin     Other reaction(s): Liver Disorder   Nitrofurantoin Monohyd Macro    Pravastatin  myalgia   Sulfa Antibiotics     Had joint aches after septra Rx.  Maybe related    Social History   Socioeconomic History   Marital status: Married    Spouse name: Jianni Mezger   Number of children: 2   Years of education: 14   Highest education level: Some college, no degree  Occupational History   Occupation: retired    Comment: Retired from [employer].  Tobacco Use   Smoking status: Never   Smokeless tobacco: Never  Vaping Use   Vaping status: Never Used  Substance and Sexual Activity   Alcohol use: No    Alcohol/week: 0.0 standard drinks of alcohol   Drug use: Never   Sexual activity: Yes    Comment: married and monogamous  Other Topics Concern   Not on file  Social History Narrative   ** Merged History Encounter **       Patient lives with spouse Harvie Heck.    Social Determinants of Health   Financial Resource Strain: Low Risk  (10/31/2022)   Overall Financial Resource Strain (CARDIA)    Difficulty of Paying Living Expenses: Not hard at all  Food Insecurity: No Food Insecurity (10/31/2022)   Hunger Vital Sign    Worried About Running Out of Food in the Last Year: Never true     Ran Out of Food in the Last Year: Never true  Transportation Needs: No Transportation Needs (10/31/2022)   PRAPARE - Administrator, Civil Service (Medical): No    Lack of Transportation (Non-Medical): No  Physical Activity: Inactive (10/31/2022)   Exercise Vital Sign    Days of Exercise per Week: 0 days    Minutes of Exercise per Session: 0 min  Stress: No Stress Concern Present (10/31/2022)   [employer] of Occupational Health - Occupational Stress Questionnaire    Feeling of Stress : Not at all  Social Connections: Moderately Integrated (10/31/2022)   Social Connection and Isolation Panel [NHANES]    Frequency of Communication with Friends and Family: More than three times a week    Frequency of Social Gatherings with Friends and Family: More than three times a week    Attends Religious Services: More than 4 times per year    Active Member of [employer] or Organizations: No    Attends Banker Meetings: Never    Marital Status: Married        Objective:  Physical Exam: BP 110/73   Pulse (!) 58   Temp 97.7 F (36.5 C) (Temporal)   Ht 5\' 4"  (1.626 m)   Wt 196 lb 9.6 oz (89.2 kg)   SpO2 100%   BMI 33.75 kg/m   Body mass index is 33.75 kg/m. Wt Readings from Last 3 Encounters:  07/30/23 196 lb 9.6 oz (89.2 kg)  12/18/22 195 lb (88.5 kg)  10/31/22 204 lb (92.5 kg)   Gen: NAD, resting comfortably HEENT: TMs normal bilaterally. OP clear. No thyromegaly noted.  CV: RRR with no murmurs appreciated Pulm: NWOB, CTAB with no crackles, wheezes, or rhonchi GI: Normal bowel sounds present. Soft, Nontender, Nondistended. MSK: no edema, cyanosis, or clubbing noted Skin: warm, dry.  Dry flaky erythematous rash surrounding nail of left first digit. Neuro: CN2-12 grossly intact. Strength 5/5 in upper and lower extremities. Reflexes symmetric and intact bilaterally.  Psych: Normal affect and thought content     Yoshito Gaza M. Jimmey Ralph, MD 07/30/2023 10:52 AM

## 2023-07-30 NOTE — Assessment & Plan Note (Signed)
Blood pressure at goal today on losartan 100 mg daily and hydralazine 25 mg 3 times daily.

## 2023-07-30 NOTE — Assessment & Plan Note (Signed)
On Crestor 10 mg daily. Check labs.

## 2023-07-30 NOTE — Assessment & Plan Note (Signed)
Will repeat bone density scan later this year.

## 2023-07-30 NOTE — Assessment & Plan Note (Signed)
On Synthroid 112 mcg daily.  Check TSH today.

## 2023-07-30 NOTE — Assessment & Plan Note (Signed)
Improved with physical therapy.  She will let us know if this continues to be an issue and we could refer to sports medicine orthopedics.

## 2023-07-30 NOTE — Assessment & Plan Note (Signed)
Uses lorazepam 1 mg  nightly as needed very sparingly.  She tries to use only as needed.  Will refill today.  Last refill was over a year ago.  Tolerates well without any significant side effects.  Database was reviewed today without red flags.

## 2023-07-30 NOTE — Patient Instructions (Addendum)
It was very nice to see you today!  We will check blood work today.  We will order a bone density scan.  I will refill your alprazolam.  Please try the cream for your finger.  If not improving please schedule a follow-up appointment with your dermatologist.  Return in about 1 year (around 07/29/2024) for Annual Physical.   Take care, Dr Jimmey Ralph  PLEASE NOTE:  If you had any lab tests, please let us know if you have not heard back within a few days. You may see your results on mychart before we have a chance to review them but we will give you a call once they are reviewed by [place].   If we ordered any referrals today, please let us know if you have not heard from their office within the next week.   If you had any urgent prescriptions sent in today, please check with the pharmacy within an hour of our visit to make sure the prescription was transmitted appropriately.   Please try these tips to maintain a healthy lifestyle:  Eat at least 3 REAL meals and 1-2 snacks per day.  Aim for no more than 5 hours between eating.  If you eat breakfast, please do so within one hour of getting up.   Each meal should contain half fruits/vegetables, one quarter protein, and one quarter carbs (no bigger than a computer mouse)  Cut down on sweet beverages. This includes juice, soda, and sweet tea.   Drink at least 1 glass of water with each meal and aim for at least 8 glasses per day  Exercise at least 150 minutes every week.    Preventive Care 18 Years and Older, Female Preventive care refers to lifestyle choices and visits with your health care provider that can promote health and wellness. Preventive care visits are also called wellness exams. What can I expect for my preventive care visit? Counseling Your health care provider may ask you questions about your: Medical history, including: Past medical problems. Family medical history. Pregnancy and menstrual history. History of falls. Current  health, including: Memory and ability to understand (cognition). Emotional well-being. Home life and relationship well-being. Sexual activity and sexual health. Lifestyle, including: Alcohol, nicotine or tobacco, and drug use. Access to firearms. Diet, exercise, and sleep habits. Work and work Astronomer. Sunscreen use. Safety issues such as seatbelt and bike helmet use. Physical exam Your health care provider will check your: Height and weight. These may be used to calculate your BMI (body mass index). BMI is a measurement that tells if you are at a healthy weight. Waist circumference. This measures the distance around your waistline. This measurement also tells if you are at a healthy weight and may help predict your risk of certain diseases, such as type 2 diabetes and high blood pressure. Heart rate and blood pressure. Body temperature. Skin for abnormal spots. What immunizations do I need?  Vaccines are usually given at various ages, according to a schedule. Your health care provider will recommend vaccines for you based on your age, medical history, and lifestyle or other factors, such as travel or where you work. What tests do I need? Screening Your health care provider may recommend screening tests for certain conditions. This may include: Lipid and cholesterol levels. Hepatitis C test. Hepatitis B test. HIV (human immunodeficiency virus) test. STI (sexually transmitted infection) testing, if you are at risk. Lung cancer screening. Colorectal cancer screening. Diabetes screening. This is done by checking your blood sugar (glucose)  after you have not eaten for a while (fasting). Mammogram. Talk with your health care provider about how often you should have regular mammograms. BRCA-related cancer screening. This may be done if you have a family history of breast, ovarian, tubal, or peritoneal cancers. Bone density scan. This is done to screen for osteoporosis. Talk with  your health care provider about your test results, treatment options, and if necessary, the need for more tests. Follow these instructions at home: Eating and drinking  Eat a diet that includes fresh fruits and vegetables, whole grains, lean protein, and low-fat dairy products. Limit your intake of foods with high amounts of sugar, saturated fats, and salt. Take vitamin and mineral supplements as recommended by your health care provider. Do not drink alcohol if your health care provider tells you not to drink. If you drink alcohol: Limit how much you have to 0-1 drink a day. Know how much alcohol is in your drink. In the [place] one drink equals one 12 oz bottle of beer (355 mL), one 5 oz glass of wine (148 mL), or one 1 oz glass of hard liquor (44 mL). Lifestyle Brush your teeth every morning and night with fluoride toothpaste. Floss one time each day. Exercise for at least 30 minutes 5 or more days each week. Do not use any products that contain nicotine or tobacco. These products include cigarettes, chewing tobacco, and vaping devices, such as e-cigarettes. If you need help quitting, ask your health care provider. Do not use drugs. If you are sexually active, practice safe sex. Use a condom or other form of protection in order to prevent STIs. Take aspirin only as told by your health care provider. Make sure that you understand how much to take and what form to take. Work with your health care provider to find out whether it is safe and beneficial for you to take aspirin daily. Ask your health care provider if you need to take a cholesterol-lowering medicine (statin). Find healthy ways to manage stress, such as: Meditation, yoga, or listening to music. Journaling. Talking to a trusted person. Spending time with friends and family. Minimize exposure to UV radiation to reduce your risk of skin cancer. Safety Always wear your seat belt while driving or riding in a vehicle. Do not drive: If  you have been drinking alcohol. Do not ride with someone who has been drinking. When you are tired or distracted. While texting. If you have been using any mind-altering substances or drugs. Wear a helmet and other protective equipment during sports activities. If you have firearms in your house, make sure you follow all gun safety procedures. What's next? Visit your health care provider once a year for an annual wellness visit. Ask your health care provider how often you should have your eyes and teeth checked. Stay up to date on all vaccines. This information is not intended to replace advice given to you by your health care provider. Make sure you discuss any questions you have with your health care provider. Document Revised: 05/04/2021 Document Reviewed: 05/04/2021 Elsevier Patient Education  2024 [employer].

## 2023-08-02 ENCOUNTER — Encounter: Payer: Self-pay | Admitting: Family Medicine

## 2023-08-02 NOTE — Telephone Encounter (Signed)
Please schedule a bone density test  Thanks

## 2023-08-02 NOTE — Progress Notes (Signed)
Labs are all at goal.  Do not need to make any changes to treatment plan.  She should keep up the great work with diet and exercise and we can recheck everything in a year or so.

## 2023-08-03 NOTE — Telephone Encounter (Signed)
Called pt and informed her that since order has been placed to be completed at [facility], she would have to call them to get scheduled. Pt verbalized understanding and I gave her the contact information needed for scheduling.

## 2023-08-03 NOTE — Telephone Encounter (Signed)
Patient called back stating that she was informed by the breast center that they aren't able to get her scheduled for her DEXA until March of next year. Can this order be changed so patient can be scheduled at [facility]?

## 2023-08-06 ENCOUNTER — Encounter: Payer: Self-pay | Admitting: Family Medicine

## 2023-08-06 NOTE — Telephone Encounter (Signed)
Lvm

## 2023-08-06 NOTE — Telephone Encounter (Signed)
Please F/U with patient for her Bone Density test

## 2023-08-07 ENCOUNTER — Ambulatory Visit (INDEPENDENT_AMBULATORY_CARE_PROVIDER_SITE_OTHER)
Admission: RE | Admit: 2023-08-07 | Discharge: 2023-08-07 | Disposition: A | Discharge status: Home / Self Care | Payer: Medicare Other | Source: Ambulatory Visit | Attending: Family Medicine | Admitting: Family Medicine

## 2023-08-07 DIAGNOSIS — Z0000 Encounter for general adult medical examination without abnormal findings: Secondary | ICD-10-CM

## 2023-08-07 DIAGNOSIS — Z13820 Encounter for screening for osteoporosis: Secondary | ICD-10-CM | POA: Diagnosis not present

## 2023-08-09 NOTE — Progress Notes (Signed)
Her bone density scan showed mild thinning of the bones called osteopenia.  This is the same as we checked a few years ago.  Do not need to make any changes to treatment plan.  She should continue with calcium and vitamin D supplementation we can recheck in a couple of years.

## 2023-09-01 ENCOUNTER — Other Ambulatory Visit: Payer: Self-pay | Admitting: Family Medicine

## 2023-09-01 DIAGNOSIS — E039 Hypothyroidism, unspecified: Secondary | ICD-10-CM

## 2023-09-01 DIAGNOSIS — I5032 Chronic diastolic (congestive) heart failure: Secondary | ICD-10-CM

## 2023-09-30 ENCOUNTER — Other Ambulatory Visit: Payer: Self-pay | Admitting: Cardiology

## 2023-10-11 ENCOUNTER — Ambulatory Visit: Payer: Medicare Other | Attending: Cardiology | Admitting: Cardiology

## 2023-10-11 ENCOUNTER — Encounter: Payer: Self-pay | Admitting: Cardiology

## 2023-10-11 VITALS — BP 106/62 | HR 67 | Ht 63.0 in | Wt 194.0 lb

## 2023-10-11 DIAGNOSIS — I10 Essential (primary) hypertension: Secondary | ICD-10-CM

## 2023-10-11 DIAGNOSIS — I340 Nonrheumatic mitral (valve) insufficiency: Secondary | ICD-10-CM

## 2023-10-11 DIAGNOSIS — I493 Ventricular premature depolarization: Secondary | ICD-10-CM | POA: Diagnosis not present

## 2023-10-11 NOTE — Progress Notes (Signed)
    Cardiology Office Note  Date: 10/11/2023   ID: Aricela, Torrealba 1956/06/06, MRN [id number]  History of Present Illness: Jacqueline Hayes is a 67 y.o. female last seen in November 2023.  She is here for a routine visit.  She does not report any palpitations, no dizziness or syncope.  She does not describe any major change in health and continues to follow with PCP routinely.  I reviewed her medications.  Present regimen includes aspirin, hydralazine, Cozaar, Lasix with potassium supplement, and Crestor.  Most recent LDL was 61.  ECG today shows normal sinus rhythm.  Physical Exam: VS:  BP 106/62   Pulse 67   Ht 5\' 3"  (1.6 m)   Wt 194 lb (88 kg)   SpO2 97%   BMI 34.37 kg/m , BMI Body mass index is 34.37 kg/m.  Wt Readings from Last 3 Encounters:  10/11/23 194 lb (88 kg)  07/30/23 196 lb 9.6 oz (89.2 kg)  12/18/22 195 lb (88.5 kg)    General: Patient appears comfortable at rest. HEENT: Conjunctiva and lids normal. Neck: Supple, no elevated JVP or carotid bruits. Lungs: Clear to auscultation, nonlabored breathing at rest. Cardiac: Regular rate and rhythm, no S3, 1/6 systolic murmur. Extremities: No pitting edema.  ECG:  An ECG dated 09/28/2022 was personally reviewed today and demonstrated:  Sinus rhythm.  Labwork: 07/30/2023: ALT 15; AST 19; BUN 20; Creatinine, Ser 0.68; Hemoglobin 12.4; Platelets 214.0; Potassium 3.5; Sodium 140; TSH 3.48     Component Value Date/Time   CHOL 166 07/30/2023 1055   CHOL 171 10/03/2021 1226   TRIG 154.0 (H) 07/30/2023 1055   HDL 73.60 07/30/2023 1055   HDL 71 10/03/2021 1226   CHOLHDL 2 07/30/2023 1055   VLDL 30.8 07/30/2023 1055   LDLCALC 61 07/30/2023 1055   LDLCALC 77 10/03/2021 1226   LDLDIRECT 113 (H) 01/03/2007 2019   Other Studies Reviewed Today:  No interval cardiac testing for review today.  Assessment and Plan:  1.  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  Asymptomatic, no palpitations or syncope.  Heart rate regular  today.  Continue observation.   2.  Mitral regurgitation, mild by follow-up echocardiogram in November of last year.   3.  Primary hypertension.  Blood pressure is well-controlled today on hydralazine and Cozaar.  4.  Mixed hyperlipidemia.  LDL 61 in September of this year.  She continues on Crestor.  Disposition:  Follow up  1 year.  Signed, Jonelle Sidle, M.D., F.A.C.C. [facility]

## 2023-10-11 NOTE — Patient Instructions (Signed)
Medication Instructions:  Your physician recommends that you continue on your current medications as directed. Please refer to the Current Medication list given to you today.   Labwork: None today  Testing/Procedures: None today  Follow-Up: 1 year  Any Other Special Instructions Will Be Listed Below (If Applicable).  If you need a refill on your cardiac medications before your next appointment, please call your pharmacy.  

## 2023-11-05 ENCOUNTER — Ambulatory Visit (INDEPENDENT_AMBULATORY_CARE_PROVIDER_SITE_OTHER): Payer: Medicare Other

## 2023-11-05 VITALS — Wt 191.0 lb

## 2023-11-05 DIAGNOSIS — Z0000 Encounter for general adult medical examination without abnormal findings: Secondary | ICD-10-CM

## 2023-11-05 NOTE — Progress Notes (Signed)
Subjective:   Jacqueline Hayes is a 67 y.o. female who presents for Medicare Annual (Subsequent) preventive examination.  Visit Complete: Virtual I connected with  Jacqueline Hayes on 11/05/23 by a audio enabled telemedicine application and verified that I am speaking with the correct person using two identifiers.  Patient Location: Home  Provider Location: Office/Clinic  I discussed the limitations of evaluation and management by telemedicine. The patient expressed understanding and agreed to proceed.  Vital Signs: Because this visit was a virtual/telehealth visit, some criteria may be missing or patient reported. Any vitals not documented were not able to be obtained and vitals that have been documented are patient reported.  Patient Medicare AWV questionnaire was completed by the patient on 11/02/23; I have confirmed that all information answered by patient is correct and no changes since this date.  Cardiac Risk Factors include: dyslipidemia;hypertension;obesity (BMI >30kg/m2)     Objective:    Today's Vitals   11/05/23 0751  Weight: 191 lb (86.6 kg)   Body mass index is 33.83 kg/m.     11/05/2023    7:59 AM 10/31/2022    7:50 AM 07/14/2021    9:42 AM 05/31/2020    9:27 AM 05/05/2020    3:43 PM 02/27/2018    1:41 PM 11/22/2017   10:43 AM  Advanced Directives  Does Patient Have a Medical Advance Directive? Yes Yes Yes No No No No  Type of Estate agent of [place];Living will Healthcare Power of [place];Living will Healthcare Power of [place];Living will      Copy of Healthcare Power of Attorney in Chart? No - copy requested No - copy requested No - copy requested      Would patient like information on creating a medical advance directive?    No - Patient declined No - Patient declined No - Patient declined No - Patient declined    Current Medications (verified) Outpatient Encounter Medications as of 11/05/2023  Medication Sig   aspirin 81 MG tablet  Take 81 mg by mouth daily.    calcium carbonate (OSCAL) 1500 (600 Ca) MG TABS tablet Take 1 tablet by mouth. Calcium 1200 mg every other day.   Calcium-Vitamin D-Vitamin K 500-100-40 MG-UNT-MCG CHEW Chew by mouth 3 (three) times daily. Viactiv   fluticasone (FLONASE) 50 MCG/ACT nasal spray Place into the nose as needed.   furosemide (LASIX) 20 MG tablet Take 1 tablet by mouth once daily   hydrALAZINE (APRESOLINE) 25 MG tablet TAKE 1 TABLET BY MOUTH THREE TIMES DAILY   ketoconazole (NIZORAL) 2 % cream Apply 1 Application topically 2 (two) times daily. (Patient taking differently: Apply 1 Application topically as needed for irritation.)   levothyroxine (SYNTHROID) 125 MCG tablet TAKE 1 TABLET BY MOUTH ONCE DAILY BEFORE BREAKFAST   LORazepam (ATIVAN) 1 MG tablet Take 1 tablet (1 mg total) by mouth at bedtime as needed for anxiety.   losartan (COZAAR) 100 MG tablet Take 1 tablet (100 mg total) by mouth daily.   Magnesium 400 MG CAPS Take 250 mg by mouth 3 (three) times daily.    Multiple Vitamins-Minerals (ZINC PO) Take 10 mg by mouth.   potassium chloride (KLOR-CON) 10 MEQ tablet TAKE 1 & 1/2 (ONE & ONE-HALF) TABLETS BY MOUTH ONCE DAILY   rosuvastatin (CRESTOR) 10 MG tablet Take 1 tablet (10 mg total) by mouth every other day.   vitamin B-12 (CYANOCOBALAMIN) 1000 MCG tablet Place 1,000 mcg under the tongue daily.   No facility-administered encounter medications on file  as of 11/05/2023.    Allergies (verified) Amlodipine, Atenolol, Catapres [clonidine hydrochloride], Fosamax [alendronate sodium], Lisinopril, Macrobid [nitrofurantoin monohydrate macrocrystals], Nitrofurantoin, Nitrofurantoin monohyd macro, Pravastatin, and Sulfa antibiotics   History: Past Medical History:  Diagnosis Date   Essential hypertension    History of kidney stones    Hypothyroidism    Mitral regurgitation    PVC's (premature ventricular contractions)    Past Surgical History:  Procedure Laterality Date    CATARACT EXTRACTION, BILATERAL Bilateral    FOOT SURGERY Left    Childhood   GASTRIC BYPASS  04/2008   NASAL SEPTUM SURGERY  1988   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   TONSILLECTOMY     1963   Family History  Problem Relation Age of Onset   Hypertension Mother    Diabetes Mother    Heart disease Father    Hypertension Sister    Diabetes Sister    Stroke Sister    Stroke Sister    Diabetes Sister    Breast cancer Sister    Hypertension Son    Hypertension Son    Diabetes Son        Type 1 Diabetes   Breast cancer Maternal Aunt    Social History   Socioeconomic History   Marital status: Married    Spouse name: Ovetta Speiser   Number of children: 2   Years of education: 14   Highest education level: Some college, no degree  Occupational History   Occupation: retired    Comment: Retired from [employer].  Tobacco Use   Smoking status: Never   Smokeless tobacco: Never  Vaping Use   Vaping status: Never Used  Substance and Sexual Activity   Alcohol use: No    Alcohol/week: 0.0 standard drinks of alcohol   Drug use: Never   Sexual activity: Yes    Comment: married and monogamous  Other Topics Concern   Not on file  Social History Narrative   ** Merged History Encounter **       Patient lives with spouse Harvie Heck.    Social Drivers of Corporate investment banker Strain: Low Risk  (11/02/2023)   Overall Financial Resource Strain (CARDIA)    Difficulty of Paying Living Expenses: Not hard at all  Food Insecurity: No Food Insecurity (11/02/2023)   Hunger Vital Sign    Worried About Running Out of Food in the Last Year: Never true    Ran Out of Food in the Last Year: Never true  Transportation Needs: No Transportation Needs (11/02/2023)   PRAPARE - Administrator, Civil Service (Medical): No    Lack of Transportation (Non-Medical): No  Physical Activity: Insufficiently  Active (11/02/2023)   Exercise Vital Sign    Days of Exercise per Week: 3 days    Minutes of Exercise per Session: 30 min  Stress: No Stress Concern Present (11/02/2023)   [employer] of Occupational Health - Occupational Stress Questionnaire    Feeling of Stress : Not at all  Social Connections: Socially Integrated (11/02/2023)   Social Connection and Isolation Panel [NHANES]    Frequency of Communication with Friends and Family: More than three times a week    Frequency of Social Gatherings with Friends and Family: More than three times a week    Attends Religious Services: More than 4 times per year    Active Member of [employer] or Organizations: Yes    Attends [employer]  or Organization Meetings: More than 4 times per year    Marital Status: Married    Tobacco Counseling Counseling given: Not Answered   Clinical Intake:  Pre-visit preparation completed: Yes  Pain : No/denies pain     BMI - recorded: 33.83 Nutritional Status: BMI > 30  Obese Nutritional Risks: None Diabetes: No  How often do you need to have someone help you when you read instructions, pamphlets, or other written materials from your doctor or pharmacy?: 1 - Never  Interpreter Needed?: No  Information entered by :: Lanier Ensign, LPN   Activities of Daily Living    11/02/2023    2:54 AM  In your present state of health, do you have any difficulty performing the following activities:  Hearing? 0  Vision? 0  Difficulty concentrating or making decisions? 0  Walking or climbing stairs? 0  Dressing or bathing? 0  Doing errands, shopping? 0  Preparing Food and eating ? N  Using the Toilet? N  In the past six months, have you accidently leaked urine? N  Do you have problems with loss of bowel control? N  Managing your Medications? N  Managing your Finances? N  Housekeeping or managing your Housekeeping? N    Patient Care Team: Ardith Dark, MD as PCP - General (Family Medicine) Jonelle Sidle, MD as PCP - Cardiology (Cardiology) Daisy Lazar, DO (Optometry)  Indicate any recent Medical Services you may have received from other than [facility] providers in the past year (date may be approximate).     Assessment:   This is a routine wellness examination for [employer].  Hearing/Vision screen Hearing Screening - Comments:: Pt denies any hearing issues  Vision Screening - Comments:: Pt follows up with Dr Charise Killian for annual eye exams    Goals Addressed             This Visit's Progress    Patient Stated       To exercise more and lose weight        Depression Screen    11/05/2023    7:56 AM 07/30/2023   10:10 AM 12/18/2022    9:27 AM 10/31/2022    7:49 AM 10/03/2021   11:41 AM 07/14/2021    9:35 AM 02/28/2021   10:56 AM  PHQ 2/9 Scores  PHQ - 2 Score 0 0 0 0 0 0 0  PHQ- 9 Score     1 0     Fall Risk    11/02/2023    2:54 AM 07/30/2023   10:10 AM 12/18/2022    9:27 AM 10/31/2022    7:51 AM 07/20/2022    9:53 AM  Fall Risk   Falls in the past year? 0 0 0 0 0  Number falls in past yr:  0 0 0 0  Injury with Fall?  0 0 0 0  Risk for fall due to : No Fall Risks No Fall Risks No Fall Risks Impaired vision No Fall Risks  Follow up Falls prevention discussed   Falls prevention discussed Falls evaluation completed    MEDICARE RISK AT HOME: Medicare Risk at Home Any stairs in or around the home?: Yes If so, are there any without handrails?: Yes Home free of loose throw rugs in walkways, pet beds, electrical cords, etc?: Yes Adequate lighting in your home to reduce risk of falls?: Yes Life alert?: No Use of a cane, walker or w/c?: No Grab bars in the bathroom?: No Shower chair or bench  in shower?: No Elevated toilet seat or a handicapped toilet?: No  TIMED UP AND GO:  Was the test performed?  No    Cognitive Function:        11/05/2023    8:01 AM 10/31/2022    7:52 AM 07/14/2021    9:54 AM  6CIT Screen  What Year? 0 points 0 points 0 points  What month?  0 points 0 points 0 points  What time? 0 points 0 points 0 points  Count back from 20 0 points 0 points 0 points  Months in reverse 0 points 0 points 0 points  Repeat phrase 0 points 0 points 0 points  Total Score 0 points 0 points 0 points    Immunizations Immunization History  Administered Date(s) Administered   Influenza,inj,Quad PF,6+ Mos 07/16/2019   Influenza-Unspecified 08/10/2020, 08/08/2021, 07/28/2022, 07/17/2023   PFIZER Comirnaty(Gray Top)Covid-19 Tri-Sucrose Vaccine 02/28/2021   PFIZER(Purple Top)SARS-COV-2 Vaccination 12/25/2019, 01/15/2020, 08/25/2020   Pfizer Covid-19 Vaccine Bivalent Booster 16yrs & up 09/05/2021   Pneumococcal Conjugate-13 08/10/2020   Pneumococcal Polysaccharide-23 07/25/2019   Respiratory Syncytial Virus Vaccine,Recomb Aduvanted(Arexvy) 10/09/2022   Td 06/21/1999   Tdap 05/31/2011, 10/03/2021   Zoster Recombinant(Shingrix) 11/23/2020, 04/13/2021    TDAP status: Up to date  Flu Vaccine status: Up to date  Pneumococcal vaccine status: Up to date  Covid-19 vaccine status: Information provided on how to obtain vaccines.   Qualifies for Shingles Vaccine? Yes   Zostavax completed Yes   Shingrix Completed?: Yes  Screening Tests Health Maintenance  Topic Date Due   COVID-19 Vaccine (6 - 2024-25 season) 07/22/2023   Pneumonia Vaccine 45+ Years old (3 of 3 - PPSV23 or PCV20) 07/24/2024   Medicare Annual Wellness (AWV)  11/04/2024   MAMMOGRAM  11/07/2024   Colonoscopy  03/30/2025   DEXA SCAN  08/06/2025   DTaP/Tdap/Td (4 - Td or Tdap) 10/04/2031   INFLUENZA VACCINE  Completed   Hepatitis C Screening  Completed   Zoster Vaccines- Shingrix  Completed   HPV VACCINES  Aged Out    Health Maintenance  Health Maintenance Due  Topic Date Due   COVID-19 Vaccine (6 - 2024-25 season) 07/22/2023    Colorectal cancer screening: Type of screening: Colonoscopy. Completed 03/31/15. Repeat every 10 years  Mammogram status: Completed 11/09/23.  Repeat every year  Bone Density status: Completed 08/07/23. Results reflect: Bone density results: OSTEOPOROSIS. Repeat every 2 years.   Additional Screening:  Hepatitis C Screening: Completed 10/21/15  Vision Screening: Recommended annual ophthalmology exams for early detection of glaucoma and other disorders of the eye. Is the patient up to date with their annual eye exam?  Yes  Who is the provider or what is the name of the office in which the patient attends annual eye exams? Dr Charise Killian  If pt is not established with a provider, would they like to be referred to a provider to establish care? No .   Dental Screening: Recommended annual dental exams for proper oral hygiene  Community Resource Referral / Chronic Care Management: CRR required this visit?  No   CCM required this visit?  No     Plan:     I have personally reviewed and noted the following in the patient's chart:   Medical and social history Use of alcohol, tobacco or illicit drugs  Current medications and supplements including opioid prescriptions. Patient is not currently taking opioid prescriptions. Functional ability and status Nutritional status Physical activity Advanced directives List of other physicians Hospitalizations, surgeries, and ER  visits in previous 12 months Vitals Screenings to include cognitive, depression, and falls Referrals and appointments  In addition, I have reviewed and discussed with patient certain preventive protocols, quality metrics, and best practice recommendations. A written personalized care plan for preventive services as well as general preventive health recommendations were provided to patient.     Marzella Schlein, LPN   16/08/[id number]   After Visit Summary: (MyChart) Due to this being a telephonic visit, the after visit summary with patients personalized plan was offered to patient via MyChart   Nurse Notes: none

## 2023-11-05 NOTE — Patient Instructions (Signed)
Jacqueline Hayes , Thank you for taking time to come for your Medicare Wellness Visit. I appreciate your ongoing commitment to your health goals. Please review the following plan we discussed and let me know if I can assist you in the future.   Referrals/Orders/Follow-Ups/Clinician Recommendations: Aim for 30 minutes of exercise or brisk walking, 6-8 glasses of water, and 5 servings of fruits and vegetables each day. Continue to work toward weight loss   This is a list of the screening recommended for you and due dates:  Health Maintenance  Topic Date Due   COVID-19 Vaccine (6 - 2024-25 season) 07/22/2023   Medicare Annual Wellness Visit  11/01/2023   Pneumonia Vaccine (3 of 3 - PPSV23 or PCV20) 07/24/2024   Mammogram  11/07/2024   Colon Cancer Screening  03/30/2025   DEXA scan (bone density measurement)  08/06/2025   DTaP/Tdap/Td vaccine (4 - Td or Tdap) 10/04/2031   Flu Shot  Completed   Hepatitis C Screening  Completed   Zoster (Shingles) Vaccine  Completed   HPV Vaccine  Aged Out    Advanced directives: (Copy Requested) Please bring a copy of your health care power of attorney and living will to the office to be added to your chart at your convenience.  Next Medicare Annual Wellness Visit scheduled for next year: Yes

## 2023-11-09 ENCOUNTER — Ambulatory Visit
Admission: RE | Admit: 2023-11-09 | Discharge: 2023-11-09 | Disposition: A | Discharge status: Home / Self Care | Payer: Medicare Other | Source: Ambulatory Visit | Attending: Family Medicine | Admitting: Family Medicine

## 2023-11-09 DIAGNOSIS — Z1231 Encounter for screening mammogram for malignant neoplasm of breast: Secondary | ICD-10-CM

## 2023-11-24 ENCOUNTER — Encounter: Payer: Self-pay | Admitting: Family Medicine

## 2023-11-24 ENCOUNTER — Other Ambulatory Visit: Payer: Self-pay | Admitting: Family Medicine

## 2023-11-24 DIAGNOSIS — I10 Essential (primary) hypertension: Secondary | ICD-10-CM

## 2023-11-24 DIAGNOSIS — I5032 Chronic diastolic (congestive) heart failure: Secondary | ICD-10-CM

## 2023-11-24 DIAGNOSIS — E039 Hypothyroidism, unspecified: Secondary | ICD-10-CM

## 2024-02-18 ENCOUNTER — Encounter: Payer: Self-pay | Admitting: Family Medicine

## 2024-02-18 ENCOUNTER — Other Ambulatory Visit: Payer: Self-pay | Admitting: Family Medicine

## 2024-02-18 DIAGNOSIS — I10 Essential (primary) hypertension: Secondary | ICD-10-CM

## 2024-02-18 DIAGNOSIS — E039 Hypothyroidism, unspecified: Secondary | ICD-10-CM

## 2024-02-18 DIAGNOSIS — I5032 Chronic diastolic (congestive) heart failure: Secondary | ICD-10-CM

## 2024-02-26 ENCOUNTER — Encounter: Payer: Self-pay | Admitting: Family Medicine

## 2024-02-26 NOTE — Telephone Encounter (Signed)
 Patient has asked that you advise what interval you think COVID vaccine should be boosted

## 2024-02-26 NOTE — Telephone Encounter (Signed)
 I think its a good idea to wait until late summer or early fall for repeat booster.  Katina Degree. Jimmey Ralph, MD 02/26/2024 12:25 PM

## 2024-04-21 ENCOUNTER — Encounter: Payer: Self-pay | Admitting: Family Medicine

## 2024-04-21 DIAGNOSIS — Z01419 Encounter for gynecological examination (general) (routine) without abnormal findings: Secondary | ICD-10-CM

## 2024-04-21 NOTE — Telephone Encounter (Signed)
 Ok to place referral.  Jinny Mounts. Daneil Dunker, MD 04/21/2024 12:17 PM

## 2024-04-24 NOTE — Telephone Encounter (Signed)
 Noted

## 2024-05-09 ENCOUNTER — Other Ambulatory Visit: Payer: Self-pay | Admitting: Family Medicine

## 2024-05-20 ENCOUNTER — Other Ambulatory Visit: Payer: Self-pay | Admitting: Family Medicine

## 2024-05-20 DIAGNOSIS — E7800 Pure hypercholesterolemia, unspecified: Secondary | ICD-10-CM

## 2024-05-20 DIAGNOSIS — I10 Essential (primary) hypertension: Secondary | ICD-10-CM

## 2024-05-20 DIAGNOSIS — E039 Hypothyroidism, unspecified: Secondary | ICD-10-CM

## 2024-05-20 DIAGNOSIS — I5032 Chronic diastolic (congestive) heart failure: Secondary | ICD-10-CM

## 2024-05-21 ENCOUNTER — Ambulatory Visit: Admitting: Family Medicine

## 2024-05-21 ENCOUNTER — Encounter: Payer: Self-pay | Admitting: Family Medicine

## 2024-05-21 VITALS — BP 100/70 | HR 73 | Temp 97.7°F | Ht 63.0 in | Wt 195.0 lb

## 2024-05-21 DIAGNOSIS — E785 Hyperlipidemia, unspecified: Secondary | ICD-10-CM

## 2024-05-21 DIAGNOSIS — I5032 Chronic diastolic (congestive) heart failure: Secondary | ICD-10-CM | POA: Diagnosis not present

## 2024-05-21 DIAGNOSIS — J029 Acute pharyngitis, unspecified: Secondary | ICD-10-CM

## 2024-05-21 DIAGNOSIS — I10 Essential (primary) hypertension: Secondary | ICD-10-CM | POA: Diagnosis not present

## 2024-05-21 LAB — POC COVID19 BINAXNOW: SARS Coronavirus 2 Ag: POSITIVE — AB

## 2024-05-21 MED ORDER — NIRMATRELVIR/RITONAVIR (PAXLOVID)TABLET: 3.0000 | ORAL_TABLET | Freq: Two times a day (BID) | ORAL | 0 refills | Status: AC

## 2024-05-21 NOTE — Patient Instructions (Addendum)
 It was very nice to see you today!  You have COVID. Please start the Paxlovid .   Make sure that you are getting plenty of fluids  Return if symptoms worsen or fail to improve.   Take care, Dr Kennyth  PLEASE NOTE:  If you had any lab tests, please let us  know if you have not heard back within a few days. You may see your results on mychart before we have a chance to review them but we will give you a call once they are reviewed by us .   If we ordered any referrals today, please let us  know if you have not heard from their office within the next week.   If you had any urgent prescriptions sent in today, please check with the pharmacy within an hour of our visit to make sure the prescription was transmitted appropriately.   Please try these tips to maintain a healthy lifestyle:  Eat at least 3 REAL meals and 1-2 snacks per day.  Aim for no more than 5 hours between eating.  If you eat breakfast, please do so within one hour of getting up.   Each meal should contain half fruits/vegetables, one quarter protein, and one quarter carbs (no bigger than a computer mouse)  Cut down on sweet beverages. This includes juice, soda, and sweet tea.   Drink at least 1 glass of water with each meal and aim for at least 8 glasses per day  Exercise at least 150 minutes every week.

## 2024-05-21 NOTE — Assessment & Plan Note (Signed)
 On Crestor  10 mg every other day.  Advised her to hold this while taking the Paxlovid .

## 2024-05-21 NOTE — Assessment & Plan Note (Signed)
 At goal today on losartan  100 mg daily and hydralazine  25 mg 3 times daily.

## 2024-05-21 NOTE — Assessment & Plan Note (Signed)
Continue management per cardiology.  No signs of volume overload today. 

## 2024-05-21 NOTE — Progress Notes (Signed)
   Jacqueline Hayes is a 68 y.o. female who presents today for an office visit.  Assessment/Plan:  New/Acute Problems: COVID  Rapid COVID test positive.  No red flags.  Reassuring exam today.  Anticipate that she will do fine with this though she is at slightly increased risk for complication due to her history of CHF and age.  We did discuss antivirals and she is interested in starting Paxlovid .  This was prescribed for her 2 years ago however she did not start it due to getting a infusion instead.  Will start paxlovid  today.  Encouraged hydration.  She can continue over-the-counter meds as needed as well.  She will let us  know if not improving over the next several days.  We discussed reasons to return to care.  Chronic Problems Addressed Today: Essential hypertension At goal today on losartan  100 mg daily and hydralazine  25 mg 3 times daily.  CHF (congestive heart failure), NYHA class I, chronic, diastolic (HCC) Continue management per cardiology.  No signs of volume overload today.  Dyslipidemia On Crestor  10 mg every other day.  Advised her to hold this while taking the Paxlovid .     Subjective:  HPI:  See Assessment / plan for status of chronic conditions.  Patient here with cough and head congestion. Her symptoms started a couple of days ago. She was recently at a wedding and thinks there may have been sick contacts there. She tried taking high dose vitamin C a few days ago. She did have some chills. She is having some post nasal drip. Her husband has been sick with similar symptoms.        Objective:  Physical Exam: BP 100/70 (BP Location: Left Arm, Patient Position: Sitting, Cuff Size: Large)   Pulse 73   Temp 97.7 F (36.5 C) (Oral)   Ht 5' 3 (1.6 m)   Wt 195 lb (88.5 kg)   SpO2 98%   BMI 34.54 kg/m   Gen: No acute distress, resting comfortably HEENT: TMs clear.  OP clear. CV: Regular rate and rhythm with no murmurs appreciated Pulm: Normal work of breathing, clear  to auscultation bilaterally with no crackles, wheezes, or rhonchi Neuro: Grossly normal, moves all extremities Psych: Normal affect and thought content      Jacqueline Hayes M. Kennyth, MD 05/21/2024 10:30 AM

## 2024-05-26 ENCOUNTER — Encounter: Payer: Self-pay | Admitting: Family Medicine

## 2024-05-26 NOTE — Telephone Encounter (Signed)
 Generally we recommend against retesting for COVID as the test may be positive for several weeks however this does not indicate an active infection but may indicate that pieces of virus that are being picked up on the test.  I would only recommend that she retest for COVID if symptoms do not continue to improve.  It is okay for her to end her isolation protocol at this point and it is okay for her to restart the rosuvastatin  as long as her symptoms are improving.  She may have a lingering cough for the next few weeks however this should be gradually improving.

## 2024-06-26 ENCOUNTER — Other Ambulatory Visit: Payer: Self-pay | Admitting: Family Medicine

## 2024-07-31 ENCOUNTER — Encounter: Payer: Medicare Other | Admitting: Family Medicine

## 2024-08-07 ENCOUNTER — Encounter: Payer: Self-pay | Admitting: Family Medicine

## 2024-08-07 ENCOUNTER — Ambulatory Visit (INDEPENDENT_AMBULATORY_CARE_PROVIDER_SITE_OTHER): Admitting: Family Medicine

## 2024-08-07 VITALS — BP 100/67 | HR 54 | Temp 97.3°F | Ht 63.0 in | Wt 192.0 lb

## 2024-08-07 DIAGNOSIS — Z131 Encounter for screening for diabetes mellitus: Secondary | ICD-10-CM

## 2024-08-07 DIAGNOSIS — I10 Essential (primary) hypertension: Secondary | ICD-10-CM

## 2024-08-07 DIAGNOSIS — Z0001 Encounter for general adult medical examination with abnormal findings: Secondary | ICD-10-CM

## 2024-08-07 DIAGNOSIS — E039 Hypothyroidism, unspecified: Secondary | ICD-10-CM

## 2024-08-07 DIAGNOSIS — E785 Hyperlipidemia, unspecified: Secondary | ICD-10-CM | POA: Diagnosis not present

## 2024-08-07 DIAGNOSIS — I872 Venous insufficiency (chronic) (peripheral): Secondary | ICD-10-CM

## 2024-08-07 DIAGNOSIS — G4700 Insomnia, unspecified: Secondary | ICD-10-CM

## 2024-08-07 LAB — COMPREHENSIVE METABOLIC PANEL WITH GFR
ALT: 17 U/L (ref 0–35)
AST: 24 U/L (ref 0–37)
Albumin: 4.3 g/dL (ref 3.5–5.2)
Alkaline Phosphatase: 72 U/L (ref 39–117)
BUN: 20 mg/dL (ref 6–23)
CO2: 29 meq/L (ref 19–32)
Calcium: 9.5 mg/dL (ref 8.4–10.5)
Chloride: 103 meq/L (ref 96–112)
Creatinine, Ser: 0.7 mg/dL (ref 0.40–1.20)
GFR: 88.03 mL/min (ref >60.00)
Glucose, Bld: 93 mg/dL (ref 70–99)
Potassium: 4.6 meq/L (ref 3.5–5.1)
Sodium: 139 meq/L (ref 135–145)
Total Bilirubin: 0.5 mg/dL (ref 0.2–1.2)
Total Protein: 6.6 g/dL (ref 6.0–8.3)

## 2024-08-07 LAB — CBC
HCT: 37.1 % (ref 36.0–46.0)
Hemoglobin: 12.5 g/dL (ref 12.0–15.0)
MCHC: 33.5 g/dL (ref 30.0–36.0)
MCV: 91.6 fl (ref 78.0–100.0)
Platelets: 194 K/uL (ref 150.0–400.0)
RBC: 4.05 Mil/uL (ref 3.87–5.11)
RDW: 14.3 % (ref 11.5–15.5)
WBC: 3.4 K/uL — ABNORMAL LOW (ref 4.0–10.5)

## 2024-08-07 LAB — LIPID PANEL
Cholesterol: 159 mg/dL (ref 0–200)
HDL: 73.4 mg/dL (ref >39.00)
LDL Cholesterol: 60 mg/dL (ref 0–99)
NonHDL: 85.41
Total CHOL/HDL Ratio: 2
Triglycerides: 128 mg/dL (ref 0.0–149.0)
VLDL: 25.6 mg/dL (ref 0.0–40.0)

## 2024-08-07 LAB — TSH: TSH: 1.45 u[IU]/mL (ref 0.35–5.50)

## 2024-08-07 LAB — HEMOGLOBIN A1C: Hgb A1c MFr Bld: 5.6 % (ref 4.6–6.5)

## 2024-08-07 NOTE — Assessment & Plan Note (Signed)
 On Lasix  as needed.  Discussed importance of salt avoidance and leg elevation.

## 2024-08-07 NOTE — Assessment & Plan Note (Signed)
 Check lipids.  Discussed lifestyle modifications.  She is on Crestor  10 mg daily.

## 2024-08-07 NOTE — Patient Instructions (Signed)
 It was very nice to see you today!  VISIT SUMMARY: You came in for your annual physical exam. We discussed your recent COVID-19 infection, current medications, and lifestyle habits. Blood work was ordered, and we updated your vaccination schedule.  YOUR PLAN: ADULT WELLNESS VISIT: Routine wellness visit following a COVID-19 infection treated with Paxlovid  without complications. -Blood work is ordered to assess blood counts, kidney and liver function, A1c, and cholesterol. -Your vaccination records will be updated, and a COVID-19 booster is scheduled for November. -Your flu vaccine is documented. -A bone density scan, colonoscopy, and pneumonia vaccine are scheduled for next year.  ESSENTIAL HYPERTENSION: Your blood pressure is well-controlled with current medication. -Continue your current medication as you are experiencing no side effects.  DYSLIPIDEMIA: Your cholesterol is managed with Crestor  without side effects. -We will assess the efficacy with blood work, including cholesterol levels.  HYPOTHYROIDISM: Your Synthroid  medication is effective. -We will assess thyroid  function with blood work.  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 -Reduce your salt intake. -Elevate your legs when possible.  INSOMNIA: Intermittent insomnia managed with lorazepam  as needed.  GENERAL HEALTH MAINTENANCE: Health maintenance, vaccinations, and lifestyle modifications were discussed. -Exercise for 30 minutes daily. -Moderate your salt and carbohydrate intake. -Choose water over diet drinks.  Return in about 1 year (around 08/07/2025) for Annual Physical.   Take care, Dr Kennyth  PLEASE NOTE:  If you had any lab tests, please let us  know if you have not heard back within a few days. You may see your results on mychart before we have a chance to review them but we will give you a call once they are reviewed by us .   If we ordered  any referrals today, please let us  know if you have not heard from their office within the next week.   If you had any urgent prescriptions sent in today, please check with the pharmacy within an hour of our visit to make sure the prescription was transmitted appropriately.   Please try these tips to maintain a healthy lifestyle:  Eat at least 3 REAL meals and 1-2 snacks per day.  Aim for no more than 5 hours between eating.  If you eat breakfast, please do so within one hour of getting up.   Each meal should contain half fruits/vegetables, one quarter protein, and one quarter carbs (no bigger than a computer mouse)  Cut down on sweet beverages. This includes juice, soda, and sweet tea.   Drink at least 1 glass of water with each meal and aim for at least 8 glasses per day  Exercise at least 150 minutes every week.    Preventive Care 40 Years and Older, Female Preventive care refers to lifestyle choices and visits with your health care provider that can promote health and wellness. Preventive care visits are also called wellness exams. What can I expect for my preventive care visit? Counseling Your health care provider may ask you questions about your: Medical history, including: Past medical problems. Family medical history. Pregnancy and menstrual history. History of falls. Current health, including: Memory and ability to understand (cognition). Emotional well-being. Home life and relationship well-being. Sexual activity and sexual health. Lifestyle, including: Alcohol, nicotine or tobacco, and drug use. Access to firearms. Diet, exercise, and sleep habits. Work and work Astronomer. Sunscreen use. Safety issues such as seatbelt and bike helmet use. Physical exam Your health care provider will check your: Height and  weight. These may be used to calculate your BMI (body mass index). BMI is a measurement that tells if you are at a healthy weight. Waist circumference. This  measures the distance around your waistline. This measurement also tells if you are at a healthy weight and may help predict your risk of certain diseases, such as type 2 diabetes and high blood pressure. Heart rate and blood pressure. Body temperature. Skin for abnormal spots. What immunizations do I need?  Vaccines are usually given at various ages, according to a schedule. Your health care provider will recommend vaccines for you based on your age, medical history, and lifestyle or other factors, such as travel or where you work. What tests do I need? Screening Your health care provider may recommend screening tests for certain conditions. This may include: Lipid and cholesterol levels. Hepatitis C test. Hepatitis B test. HIV (human immunodeficiency virus) test. STI (sexually transmitted infection) testing, if you are at risk. Lung cancer screening. Colorectal cancer screening. Diabetes screening. This is done by checking your blood sugar (glucose) after you have not eaten for a while (fasting). Mammogram. Talk with your health care provider about how often you should have regular mammograms. BRCA-related cancer screening. This may be done if you have a family history of breast, ovarian, tubal, or peritoneal cancers. Bone density scan. This is done to screen for osteoporosis. Talk with your health care provider about your test results, treatment options, and if necessary, the need for more tests. Follow these instructions at home: Eating and drinking  Eat a diet that includes fresh fruits and vegetables, whole grains, lean protein, and low-fat dairy products. Limit your intake of foods with high amounts of sugar, saturated fats, and salt. Take vitamin and mineral supplements as recommended by your health care provider. Do not drink alcohol if your health care provider tells you not to drink. If you drink alcohol: Limit how much you have to 0-1 drink a day. Know how much alcohol is in  your drink. In the [place] one drink equals one 12 oz bottle of beer (355 mL), one 5 oz glass of wine (148 mL), or one 1 oz glass of hard liquor (44 mL). Lifestyle Brush your teeth every morning and night with fluoride toothpaste. Floss one time each day. Exercise for at least 30 minutes 5 or more days each week. Do not use any products that contain nicotine or tobacco. These products include cigarettes, chewing tobacco, and vaping devices, such as e-cigarettes. If you need help quitting, ask your health care provider. Do not use drugs. If you are sexually active, practice safe sex. Use a condom or other form of protection in order to prevent STIs. Take aspirin only as told by your health care provider. Make sure that you understand how much to take and what form to take. Work with your health care provider to find out whether it is safe and beneficial for you to take aspirin daily. Ask your health care provider if you need to take a cholesterol-lowering medicine (statin). Find healthy ways to manage stress, such as: Meditation, yoga, or listening to music. Journaling. Talking to a trusted person. Spending time with friends and family. Minimize exposure to UV radiation to reduce your risk of skin cancer. Safety Always wear your seat belt while driving or riding in a vehicle. Do not drive: If you have been drinking alcohol. Do not ride with someone who has been drinking. When you are tired or distracted. While texting. If you have  been using any mind-altering substances or drugs. Wear a helmet and other protective equipment during sports activities. If you have firearms in your house, make sure you follow all gun safety procedures. What's next? Visit your health care provider once a year for an annual wellness visit. Ask your health care provider how often you should have your eyes and teeth checked. Stay up to date on all vaccines. This information is not intended to replace advice given  to you by your health care provider. Make sure you discuss any questions you have with your health care provider. Document Revised: 05/04/2021 Document Reviewed: 05/04/2021 Elsevier Patient Education  2024 [employer].

## 2024-08-07 NOTE — Progress Notes (Signed)
 Chief Complaint:  Jacqueline Hayes is a 68 y.o. female who presents today for her annual comprehensive physical exam.    Assessment/Plan:  Chronic Problems Addressed Today: Hypothyroidism On Synthroid  112 mcg daily.  Check TSH.  Essential hypertension On losartan  100 mg daily and hydralazine  25 mg 3 times daily.  Blood pressure at goal today.  Dyslipidemia Check lipids.  Discussed lifestyle modifications.  She is on Crestor  10 mg daily.  Insomnia Uses lorazepam  1 mg nightly as needed very sparingly.  Does not need refill today.  Tolerates well.  No significant side effects.  Chronic venous insufficiency On Lasix  as needed.  Discussed importance of salt avoidance and leg elevation.  Preventative Healthcare: Check labs.  Up-to-date on flu vaccine.  Will get COVID-vaccine in a couple of months.  Due for colonoscopy and bone density scan next month.  Patient Counseling(The following topics were reviewed and/or handout was given):  -Nutrition: Stressed importance of moderation in sodium/caffeine intake, saturated fat and cholesterol, caloric balance, sufficient intake of fresh fruits, vegetables, and fiber.  -Stressed the importance of regular exercise.   -Substance Abuse: Discussed cessation/primary prevention of tobacco, alcohol, or other drug use; driving or other dangerous activities under the influence; availability of treatment for abuse.   -Injury prevention: Discussed safety belts, safety helmets, smoke detector, smoking near bedding or upholstery.   -Sexuality: Discussed sexually transmitted diseases, partner selection, use of condoms, avoidance of unintended pregnancy and contraceptive alternatives.   -Dental health: Discussed importance of regular tooth brushing, flossing, and dental visits.  -Health maintenance and immunizations reviewed. Please refer to Health maintenance section.  Return to care in 1 year for next preventative visit.     Subjective:  HPI:  She has no  acute complaints today. Patient is here today for her  annual physical.  See assessment / plan for status of chronic conditions.  Discussed the use of AI scribe software for clinical note transcription with the patient, who gave verbal consent to proceed.  History of Present Illness Jacqueline Hayes is a 68 year old female who presents for an annual physical exam.  She had COVID-19 in July 2025, with a severe sore throat as the most prominent symptom. She was treated with Paxlovid  without complications. She inquires about the timing for her next COVID-19 vaccination.  She received her flu shot on July 28, 2024, and has previously received two pneumonia vaccines. She has also had her shingles vaccine.  She is currently taking Synthroid  and lorazepam . She uses lorazepam  as needed for sleep, taking half a tablet if she cannot fall asleep after an hour and a half. No side effects from her medications.  She mentions being lax with her diet and exercise, not following a regular exercise schedule and consuming salty foods. She primarily drinks water and avoids sweets. She has a history of fluid retention in her ankles, especially in hot weather, which she attributes to salt intake.  She has had cataract surgery in the past and was informed about a potential film on one of her cataracts that may need removal. She reports stable vision and hearing.    Lifestyle Diet: Trying to avoid sweets.  Exercise: None specific.      08/07/2024   10:04 AM  Depression screen PHQ 2/9  Decreased Interest 0  Down, Depressed, Hopeless 0  PHQ - 2 Score 0    Health Maintenance Due  Topic Date Due   Influenza Vaccine  06/20/2024     ROS: Per  HPI, otherwise a complete review of systems was negative.   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/2019   CHF (congestive heart failure), NYHA class I, chronic, diastolic (HCC) 07/19/2017   Osteoporosis 04/06/2016   Chronic venous insufficiency 04/06/2016   H/O bariatric surgery 03/25/2012   Hearing loss 11/23/2011   Fuchs' corneal dystrophy 05/31/2011   Hypothyroidism 01/17/2007   Dyslipidemia 01/17/2007   Essential hypertension 01/17/2007   Past Surgical History:  Procedure Laterality Date   CATARACT EXTRACTION, BILATERAL Bilateral    FOOT SURGERY Left    Childhood   GASTRIC BYPASS  04/2008   NASAL SEPTUM SURGERY  1988   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      calcium  carbonate (OSCAL) 1500 (600 Ca) MG TABS tablet Take 1 tablet by mouth. Calcium  1200 mg every other day.     Calcium -Vitamin D -Vitamin K 500-100-40 MG-UNT-MCG CHEW Chew by mouth 3 (three) times daily. Viactiv     fluticasone (FLONASE) 50 MCG/ACT nasal spray Place into the nose as needed.     furosemide  (LASIX ) 20 MG tablet Take 1 tablet by mouth once daily 90 tablet 0   hydrALAZINE  (APRESOLINE ) 25 MG tablet TAKE 1 TABLET BY MOUTH THREE TIMES DAILY 270 tablet 3   ketoconazole  (NIZORAL )  2 % cream Apply 1 Application topically 2 (two) times daily. (Patient taking differently: Apply 1 Application topically as needed for irritation.) 60 g 0   levothyroxine  (SYNTHROID ) 125 MCG tablet TAKE 1 TABLET BY MOUTH ONCE DAILY BEFORE BREAKFAST 90 tablet 0   LORazepam  (ATIVAN ) 1 MG tablet TAKE 1 TABLET BY MOUTH AT BEDTIME AS NEEDED FOR ANXIETY 30 tablet 0   losartan  (COZAAR ) 100 MG tablet Take 1 tablet by mouth once daily 90 tablet 0   Magnesium 400 MG CAPS Take 250 mg by mouth 3 (three) times daily.      Multiple Vitamins-Minerals (ZINC PO) Take 10 mg by mouth.     potassium chloride  (KLOR-CON ) 10 MEQ tablet TAKE 1 & 1/2 (ONE & ONE-HALF) TABLETS BY MOUTH ONCE DAILY 135 tablet 0   rosuvastatin  (CRESTOR ) 10 MG tablet TAKE 1 TABLET BY MOUTH EVERY OTHER DAY 45 tablet 0   vitamin B-12 (CYANOCOBALAMIN) 1000 MCG tablet Place 1,000 mcg under the tongue daily.     No current  facility-administered medications for this visit.    Allergies-reviewed and updated Allergies  Allergen Reactions   Amlodipine      Leg swelling   Atenolol      bradycardia   Catapres [Clonidine Hydrochloride]     Blood pressure dropped too low after 1st dose   Fosamax  [Alendronate  Sodium]     Jaw pain.  Aching joints.   Lisinopril     REACTION: ? ACE cough in the past?   Macrobid [Nitrofurantoin Monohydrate Macrocrystals]     Causes elevation of liver enzymes   Nitrofurantoin     Other reaction(s): Liver Disorder   Nitrofurantoin Monohyd Macro    Pravastatin      myalgia   Sulfa  Antibiotics     Had joint aches after septra  Rx.  Maybe related    Social History   Socioeconomic History   Marital status: Married    Spouse name: Beatrice Ziehm   Number of children: 2   Years of education: 14   Highest education level: 12th grade  Occupational History   Occupation: retired    Comment: Retired from [employer].  Tobacco Use   Smoking status: Never   Smokeless tobacco: Never   Vaping Use   Vaping status: Never Used  Substance and Sexual Activity   Alcohol use: No    Alcohol/week: 0.0 standard drinks of alcohol   Drug use: Never   Sexual activity: Yes    Comment: married and monogamous  Other Topics Concern   Not on file  Social History Narrative   ** Merged History Encounter **       Patient lives with spouse Darina.    Social Drivers of Corporate investment banker Strain: Low Risk  (08/01/2024)   Overall Financial Resource Strain (CARDIA)    Difficulty of Paying Living Expenses: Not hard at all  Food Insecurity: No Food Insecurity (08/01/2024)   Hunger Vital Sign    Worried About Running Out of Food in the Last Year: Never true    Ran Out of Food in the Last Year: Never true  Transportation Needs: No Transportation Needs (08/01/2024)   PRAPARE - Administrator, Civil Service (Medical): No    Lack of Transportation (Non-Medical): No  Physical Activity: Insufficiently Active (08/01/2024)   Exercise Vital Sign    Days of Exercise per Week: 2 days    Minutes of Exercise per Session: 30 min  Stress: No Stress Concern Present (08/01/2024)   [employer] of Occupational Health - Occupational Stress Questionnaire    Feeling of Stress: Not at all  Social Connections: Socially Integrated (08/01/2024)   Social Connection and Isolation Panel    Frequency of Communication with Friends and Family: More than three times a week    Frequency of Social Gatherings with Friends and Family: More than three times a week    Attends Religious Services: More than 4 times per year    Active Member of [employer] or Organizations: Yes    Attends Engineer, structural: More than 4 times per year    Marital Status: Married        Objective:  Physical Exam: BP 100/67   Pulse (!) 54   Temp (!) 97.3 F (36.3 C) (Temporal)   Ht 5' 3 (1.6 m)   Wt 192 lb (87.1 kg)   SpO2 98%   BMI 34.01 kg/m   Body mass index is 34.01 kg/m. Wt Readings from Last 3  Encounters:  08/07/24 192 lb (  87.1 kg)  05/21/24 195 lb (88.5 kg)  11/05/23 191 lb (86.6 kg)   Gen: NAD, resting comfortably HEENT: TMs normal bilaterally. OP clear. No thyromegaly noted.  CV: RRR with no murmurs appreciated Pulm: NWOB, CTAB with no crackles, wheezes, or rhonchi GI: Normal bowel sounds present. Soft, Nontender, Nondistended. MSK: no edema, cyanosis, or clubbing noted Skin: warm, dry Neuro: CN2-12 grossly intact. Strength 5/5 in upper and lower extremities. Reflexes symmetric and intact bilaterally.  Psych: Normal affect and thought content     Domique Clapper M. Kennyth, MD 08/07/2024 10:32 AM

## 2024-08-07 NOTE — Assessment & Plan Note (Signed)
 On losartan  100 mg daily and hydralazine  25 mg 3 times daily.  Blood pressure at goal today.

## 2024-08-07 NOTE — Assessment & Plan Note (Signed)
 Uses lorazepam  1 mg nightly as needed very sparingly.  Does not need refill today.  Tolerates well.  No significant side effects.

## 2024-08-07 NOTE — Assessment & Plan Note (Signed)
On Synthroid 112 mcg daily.  Check TSH. 

## 2024-08-08 ENCOUNTER — Ambulatory Visit: Payer: Self-pay | Admitting: Family Medicine

## 2024-08-08 ENCOUNTER — Other Ambulatory Visit: Payer: Self-pay | Admitting: Family Medicine

## 2024-08-08 DIAGNOSIS — Z1231 Encounter for screening mammogram for malignant neoplasm of breast: Secondary | ICD-10-CM

## 2024-08-08 DIAGNOSIS — D729 Disorder of white blood cells, unspecified: Secondary | ICD-10-CM

## 2024-08-08 NOTE — Progress Notes (Signed)
 Her white blood cell count was a little low.  This is probably nothing significant however I would like her to come back in 1 month to recheck.  Please place future order for CBC with differential.  All of her other labs are at goal and we can recheck everything in a year.

## 2024-08-19 ENCOUNTER — Other Ambulatory Visit: Payer: Self-pay | Admitting: Family Medicine

## 2024-08-19 DIAGNOSIS — E039 Hypothyroidism, unspecified: Secondary | ICD-10-CM

## 2024-08-19 DIAGNOSIS — E7800 Pure hypercholesterolemia, unspecified: Secondary | ICD-10-CM

## 2024-08-19 DIAGNOSIS — I10 Essential (primary) hypertension: Secondary | ICD-10-CM

## 2024-08-19 DIAGNOSIS — I5032 Chronic diastolic (congestive) heart failure: Secondary | ICD-10-CM

## 2024-08-20 ENCOUNTER — Other Ambulatory Visit: Payer: Self-pay | Admitting: Family Medicine

## 2024-09-08 ENCOUNTER — Other Ambulatory Visit (INDEPENDENT_AMBULATORY_CARE_PROVIDER_SITE_OTHER)

## 2024-09-08 DIAGNOSIS — D729 Disorder of white blood cells, unspecified: Secondary | ICD-10-CM | POA: Diagnosis not present

## 2024-09-08 LAB — CBC WITH DIFFERENTIAL/PLATELET
Basophils Absolute: 0 K/uL (ref 0.0–0.1)
Basophils Relative: 1.3 % (ref 0.0–3.0)
Eosinophils Absolute: 0 K/uL (ref 0.0–0.7)
Eosinophils Relative: 1.2 % (ref 0.0–5.0)
HCT: 36.1 % (ref 36.0–46.0)
Hemoglobin: 12 g/dL (ref 12.0–15.0)
Lymphocytes Relative: 27 % (ref 12.0–46.0)
Lymphs Abs: 0.9 K/uL (ref 0.7–4.0)
MCHC: 33.4 g/dL (ref 30.0–36.0)
MCV: 90.9 fl (ref 78.0–100.0)
Monocytes Absolute: 0.4 K/uL (ref 0.1–1.0)
Monocytes Relative: 12.1 % — ABNORMAL HIGH (ref 3.0–12.0)
Neutro Abs: 1.9 K/uL (ref 1.4–7.7)
Neutrophils Relative %: 58.4 % (ref 43.0–77.0)
Platelets: 193 K/uL (ref 150.0–400.0)
RBC: 3.97 Mil/uL (ref 3.87–5.11)
RDW: 13.7 % (ref 11.5–15.5)
WBC: 3.3 K/uL — ABNORMAL LOW (ref 4.0–10.5)

## 2024-09-09 ENCOUNTER — Ambulatory Visit: Payer: Self-pay | Admitting: Family Medicine

## 2024-09-09 ENCOUNTER — Other Ambulatory Visit: Payer: Self-pay | Admitting: *Deleted

## 2024-09-09 DIAGNOSIS — D729 Disorder of white blood cells, unspecified: Secondary | ICD-10-CM

## 2024-09-09 NOTE — Progress Notes (Signed)
 Her total WBC are still low. This is probably nothing to worry about however it would be a good idea for her to see hematology for further evaluation.  Please place referral if she is agreeable.

## 2024-09-09 NOTE — Telephone Encounter (Signed)
 It is possible that infection may alter WBCs though typically this causes an elevation.  It is fine for us  to wait another few months to recheck before deciding on hematology referral.  Please place future order for CBC with differential if patient is agreeable.

## 2024-09-09 NOTE — Telephone Encounter (Signed)
 See note

## 2024-09-21 ENCOUNTER — Other Ambulatory Visit: Payer: Self-pay | Admitting: Cardiology

## 2024-10-22 ENCOUNTER — Ambulatory Visit: Admitting: Cardiology

## 2024-10-28 ENCOUNTER — Ambulatory Visit: Admitting: Cardiology

## 2024-11-10 ENCOUNTER — Inpatient Hospital Stay: Admission: RE | Admit: 2024-11-10 | Discharge: 2024-11-10 | Attending: Family Medicine | Admitting: Family Medicine

## 2024-11-10 DIAGNOSIS — Z1231 Encounter for screening mammogram for malignant neoplasm of breast: Secondary | ICD-10-CM

## 2024-11-12 ENCOUNTER — Ambulatory Visit

## 2024-11-12 VITALS — Ht 63.0 in | Wt 192.0 lb

## 2024-11-12 DIAGNOSIS — Z0000 Encounter for general adult medical examination without abnormal findings: Secondary | ICD-10-CM | POA: Diagnosis not present

## 2024-11-12 NOTE — Patient Instructions (Signed)
 Jacqueline Hayes,  Thank you for taking the time for your Medicare Wellness Visit. I appreciate your continued commitment to your health goals. Please review the care plan we discussed, and feel free to reach out if I can assist you further.  Please note that Annual Wellness Visits do not include a physical exam. Some assessments may be limited, especially if the visit was conducted virtually. If needed, we may recommend an in-person follow-up with your provider.  Ongoing Care Seeing your primary care provider every 3 to 6 months helps us  monitor your health and provide consistent, personalized care.   Referrals If a referral was made during today's visit and you haven't received any updates within two weeks, please contact the referred provider directly to check on the status.  Recommended Screenings:  Health Maintenance  Topic Date Due   COVID-19 Vaccine (8 - 2025-26 season) 07/21/2024   Medicare Annual Wellness Visit  11/04/2024   Colon Cancer Screening  03/30/2025   Osteoporosis screening with Bone Density Scan  08/06/2025   Pneumococcal Vaccine for age over 46 (3 of 3 - PCV20 or PCV21) 08/10/2025   Breast Cancer Screening  11/08/2025   DTaP/Tdap/Td vaccine (5 - Td or Tdap) 10/04/2031   Flu Shot  Completed   Hepatitis C Screening  Completed   Zoster (Shingles) Vaccine  Completed   Meningitis B Vaccine  Aged Out       11/10/2024    7:59 PM  Advanced Directives  Does Patient Have a Medical Advance Directive? Yes  Type of Advance Directive Healthcare Power of Attorney  Copy of Healthcare Power of Attorney in Chart? No - copy requested    Vision: Annual vision screenings are recommended for early detection of glaucoma, cataracts, and diabetic retinopathy. These exams can also reveal signs of chronic conditions such as diabetes and high blood pressure.  Dental: Annual dental screenings help detect early signs of oral cancer, gum disease, and other conditions linked to overall  health, including heart disease and diabetes.  Please see the attached documents for additional preventive care recommendations.

## 2024-11-12 NOTE — Progress Notes (Signed)
 "  Chief Complaint  Patient presents with   Medicare Wellness     Subjective:   Jacqueline Hayes is a 68 y.o. female who presents for a Medicare Annual Wellness Visit.  Visit info / Clinical Intake: Medicare Wellness Visit Type:: Subsequent Annual Wellness Visit Persons participating in visit and providing information:: patient Medicare Wellness Visit Mode:: Telephone If telephone:: video declined Since this visit was completed virtually, some vitals may be partially provided or unavailable. Missing vitals are due to the limitations of the virtual format.: Unable to obtain vitals - no equipment If Telephone or Video please confirm:: I connected with patient using audio/video enable telemedicine. I verified patient identity with two identifiers, discussed telehealth limitations, and patient agreed to proceed. Patient Location:: home Provider Location:: home office Interpreter Needed?: No Pre-visit prep was completed: yes AWV questionnaire completed by patient prior to visit?: yes Date:: 11/09/24 Living arrangements:: (Patient-Rptd) lives with spouse/significant other Patient's Overall Health Status Rating: (Patient-Rptd) very good Typical amount of pain: (Patient-Rptd) some Does pain affect daily life?: (Patient-Rptd) no Are you currently prescribed opioids?: no  Dietary Habits and Nutritional Risks How many meals a day?: (Patient-Rptd) 2 Eats fruit and vegetables daily?: (Patient-Rptd) yes Most meals are obtained by: (Patient-Rptd) preparing own meals; eating out In the last 2 weeks, have you had any of the following?: none Diabetic:: no  Functional Status Activities of Daily Living (to include ambulation/medication): (Patient-Rptd) Independent Ambulation: Independent with device- listed below Home Assistive Devices/Equipment: Eyeglasses Medication Administration: (Patient-Rptd) Independent Home Management (perform basic housework or laundry): (Patient-Rptd) Independent Manage  your own finances?: (Patient-Rptd) yes Primary transportation is: (Patient-Rptd) driving Concerns about vision?: no *vision screening is required for WTM* Concerns about hearing?: no  Fall Screening Falls in the past year?: (Patient-Rptd) 0 Number of falls in past year: 0 Was there an injury with Fall?: 0 Fall Risk Category Calculator: 0 Patient Fall Risk Level: Low Fall Risk  Fall Risk Patient at Risk for Falls Due to: No Fall Risks Fall risk Follow up: Falls evaluation completed  Home and Transportation Safety: All rugs have non-skid backing?: (Patient-Rptd) yes All stairs or steps have railings?: (Patient-Rptd) N/A, no stairs Grab bars in the bathtub or shower?: (!) (Patient-Rptd) no Have non-skid surface in bathtub or shower?: (Patient-Rptd) yes Good home lighting?: (Patient-Rptd) yes Regular seat belt use?: (Patient-Rptd) yes Hospital stays in the last year:: (Patient-Rptd) no  Cognitive Assessment Difficulty concentrating, remembering, or making decisions? : (Patient-Rptd) no Will 6CIT or Mini Cog be Completed: yes What year is it?: 0 points What month is it?: 0 points Give patient an address phrase to remember (5 components): [address] [place]  About what time is it?: 0 points Count backwards from 20 to 1: 0 points Say the months of the year in reverse: 0 points Repeat the address phrase from earlier: 0 points 6 CIT Score: 0 points  Advance Directives (For Healthcare) Does Patient Have a Medical Advance Directive?: Yes Type of Advance Directive: Healthcare Power of Attorney Copy of Healthcare Power of Attorney in Chart?: No - copy requested  Reviewed/Updated  Reviewed/Updated: Reviewed All (Medical, Surgical, Family, Medications, Allergies, Care Teams, Patient Goals)    Allergies (verified) Amlodipine , Atenolol , Catapres [clonidine hydrochloride], Fosamax  [alendronate  sodium], Lisinopril, Macrobid [nitrofurantoin monohydrate macrocrystals], Nitrofurantoin,  Nitrofurantoin monohyd macro, Pravastatin , and Sulfa  antibiotics   Current Medications (verified) Outpatient Encounter Medications as of 11/12/2024  Medication Sig   aspirin 81 MG tablet Take 81 mg by mouth daily.    calcium  carbonate (  OSCAL) 1500 (600 Ca) MG TABS tablet Take 1 tablet by mouth. Calcium  1200 mg every other day.   Calcium -Vitamin D -Vitamin K 500-100-40 MG-UNT-MCG CHEW Chew by mouth 3 (three) times daily. Viactiv   clobetasol ointment (TEMOVATE) 0.05 % Apply 1 Application topically 2 (two) times a week.   fluticasone (FLONASE) 50 MCG/ACT nasal spray Place into the nose as needed.   furosemide  (LASIX ) 20 MG tablet Take 1 tablet by mouth once daily   hydrALAZINE  (APRESOLINE ) 25 MG tablet TAKE 1 TABLET BY MOUTH THREE TIMES DAILY   levothyroxine  (SYNTHROID ) 125 MCG tablet TAKE 1 TABLET BY MOUTH ONCE DAILY BEFORE BREAKFAST   LORazepam  (ATIVAN ) 1 MG tablet TAKE 1 TABLET BY MOUTH AT BEDTIME AS NEEDED FOR ANXIETY   losartan  (COZAAR ) 100 MG tablet Take 1 tablet by mouth once daily   Magnesium 400 MG CAPS Take 250 mg by mouth 3 (three) times daily.    Multiple Vitamins-Minerals (ZINC PO) Take 10 mg by mouth.   potassium chloride  (KLOR-CON ) 10 MEQ tablet TAKE 1 & 1/2 (ONE & ONE-HALF) TABLETS BY MOUTH ONCE DAILY   rosuvastatin  (CRESTOR ) 10 MG tablet TAKE 1 TABLET BY MOUTH EVERY OTHER DAY   vitamin B-12 (CYANOCOBALAMIN) 1000 MCG tablet Place 1,000 mcg under the tongue daily.   [DISCONTINUED] ketoconazole  (NIZORAL ) 2 % cream Apply 1 Application topically 2 (two) times daily. (Patient taking differently: Apply 1 Application topically as needed for irritation.)   No facility-administered encounter medications on file as of 11/12/2024.    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  Tobacco Use   Smoking status: Never   Smokeless tobacco: Never  Vaping Use   Vaping status: Never Used  Substance and Sexual Activity   Alcohol use: No    Alcohol/week: 0.0 standard drinks of alcohol   Drug use: Never   Sexual activity: Yes    Comment: married and monogamous   Tobacco Counseling Counseling given: Not Answered  SDOH Screenings   Food Insecurity: No Food Insecurity (11/10/2024)  Housing: Low Risk (11/10/2024)  Transportation Needs: No Transportation Needs (11/10/2024)  Utilities: Not At Risk (11/12/2024)  Alcohol Screen: Low Risk (11/12/2024)  Depression (PHQ2-9): Low Risk (11/12/2024)  Financial Resource Strain: Low Risk (11/10/2024)  Physical Activity: Insufficiently Active (11/10/2024)  Social Connections: Socially Integrated (11/10/2024)  Stress: No Stress Concern Present (11/10/2024)  Tobacco  Use: Low Risk (11/12/2024)  Health Literacy: Adequate Health Literacy (11/12/2024)   See flowsheets for full screening details  Depression Screen PHQ 2 & 9 Depression Scale- Over the past 2 weeks, how often have you been bothered by any of the following problems? Little interest or pleasure in doing things: 0 Feeling down, depressed, or hopeless  (PHQ Adolescent also includes...irritable): 0 PHQ-2 Total Score: 0     Goals Addressed               This Visit's Progress     maintain health and activity (pt-stated)        Maintain health and activity              Objective:    Today's Vitals   11/12/24 0804  Weight: 192 lb (87.1 kg)  Height: 5' 3 (1.6 m)   Body mass index is 34.01 kg/m.  Hearing/Vision screen Hearing Screening - Comments:: Pt denies any hearing issues  Vision Screening - Comments:: Wears rx glasses - up to date with routine eye exams with Dr Darroll  Immunizations and Health Maintenance Health Maintenance  Topic Date Due   COVID-19 Vaccine (8 - 2025-26 season) 07/21/2024   Colonoscopy  03/30/2025   Bone Density Scan  08/06/2025   Pneumococcal Vaccine: 50+ Years (3 of 3 - PCV20 or PCV21) 08/10/2025   Mammogram  11/08/2025   Medicare Annual Wellness (AWV)  11/12/2025   DTaP/Tdap/Td (5 - Td or Tdap) 10/04/2031   Influenza Vaccine  Completed   Hepatitis C Screening  Completed   Zoster Vaccines- Shingrix  Completed   Meningococcal B Vaccine  Aged Out        Assessment/Plan:  This is a routine wellness examination for [employer].  Patient Care Team: Kennyth Worth HERO, MD as PCP - General (Family Medicine) Debera Jayson MATSU, MD as PCP - Cardiology (Cardiology) Darroll Anes, DO [facility])  I have personally reviewed and noted the following in the patients chart:   Medical and social history Use of alcohol, tobacco or illicit drugs  Current medications and supplements including opioid prescriptions. Functional ability and status Nutritional status Physical activity Advanced directives List of other physicians Hospitalizations, surgeries, and ER visits in previous 12 months Vitals Screenings to include cognitive, depression, and falls Referrals and appointments  No orders of the defined types were placed in this encounter.  In addition, I have reviewed and discussed with patient  certain preventive protocols, quality metrics, and best practice recommendations. A written personalized care plan for preventive services as well as general preventive health recommendations were provided to patient.   Ellouise VEAR Haws, LPN   [id number]/7974   Return in about 1 year (around 11/16/2025).  After Visit Summary: (MyChart) Due to this being a telephonic visit, the after visit summary with patients personalized plan was offered to patient via MyChart   Nurse Notes: No voiced or noted concerns at this time  "

## 2024-11-20 ENCOUNTER — Other Ambulatory Visit: Payer: Self-pay | Admitting: Family Medicine

## 2024-11-20 DIAGNOSIS — I10 Essential (primary) hypertension: Secondary | ICD-10-CM

## 2024-11-20 DIAGNOSIS — I5032 Chronic diastolic (congestive) heart failure: Secondary | ICD-10-CM

## 2024-11-20 DIAGNOSIS — E7800 Pure hypercholesterolemia, unspecified: Secondary | ICD-10-CM

## 2024-11-20 DIAGNOSIS — E039 Hypothyroidism, unspecified: Secondary | ICD-10-CM

## 2024-11-21 ENCOUNTER — Telehealth: Payer: Self-pay

## 2024-11-21 ENCOUNTER — Ambulatory Visit: Admitting: Student in an Organized Health Care Education/Training Program

## 2024-11-21 ENCOUNTER — Ambulatory Visit

## 2024-11-21 ENCOUNTER — Ambulatory Visit: Payer: Self-pay

## 2024-11-21 ENCOUNTER — Encounter: Payer: Self-pay | Admitting: Student in an Organized Health Care Education/Training Program

## 2024-11-21 VITALS — BP 111/52 | HR 66 | Temp 98.3°F | Ht 63.0 in | Wt 193.4 lb

## 2024-11-21 DIAGNOSIS — J111 Influenza due to unidentified influenza virus with other respiratory manifestations: Secondary | ICD-10-CM | POA: Diagnosis not present

## 2024-11-21 NOTE — Patient Instructions (Signed)
" °  VISIT SUMMARY: Today, you were seen for a persistent cough and dark green sputum production that began after a recent vacation. You have no fever, body aches, or shortness of breath, but you do have a raw throat and watery eyes. You also mentioned frequent urination, which you believe is due to increased fluid intake and your use of furosemide  for ankle edema.  YOUR PLAN: -ACUTE BRONCHITIS: Acute bronchitis is an inflammation of the bronchial tubes, usually caused by a viral infection, leading to symptoms like cough and sputum production. You are advised to rest, stay hydrated, and take Tylenol  or ibuprofen (up to 400 mg twice a day) for symptom relief. A chest x-ray has been ordered to rule out pneumonia. If the x-ray is clear, continue with supportive care and monitor your symptoms. If pneumonia is detected, antibiotics will be started.  -CHRONIC LOWER EXTREMITY EDEMA: Chronic lower extremity edema is swelling in the legs due to fluid retention, often managed with medications like furosemide . To maintain hydration during your acute illness, you should temporarily stop taking furosemide .  INSTRUCTIONS: Please get a chest x-ray as soon as possible to rule out pneumonia. Follow up with us  after the x-ray results are available to discuss the next steps. Continue to rest and stay hydrated, and monitor your symptoms closely. If you notice any worsening of symptoms or new symptoms, please contact our office immediately.    "

## 2024-11-21 NOTE — Addendum Note (Signed)
 Addended by: JERRELL SOLIAN T on: 11/21/2024 02:46 PM   Modules accepted: Orders

## 2024-11-21 NOTE — Telephone Encounter (Signed)
 Tried to call patient to let her know that xray at [facility] is not there today. One of the medical assistants here was told the wrong information prior to patient leaving our office. I was not able to leave a vm.

## 2024-11-21 NOTE — Progress Notes (Signed)
 "  Acute Office Visit  Patient ID: Jacqueline Hayes, female    DOB: 1956-08-29, 69 y.o.   MRN: [id number]  PCP: Kennyth Worth CHRISTELLA, MD  Chief Complaint  Patient presents with   Cough     Laryngitis last Saturday12/27 - scratchy throat. Just got back from the beach. Laryngitis better but cough is worse. dark green mucus. Denied chills, fever, body aches. Feeling tired. Headache. Had flu/covid vaccine. Use halls, flonase.    Sore Throat    On L side. Feels like there is fluid on L ear. L tongue is sore.     Subjective:     HPI  Discussed the use of AI scribe software for clinical note transcription with the patient, who gave verbal consent to proceed.  History of Present Illness Jacqueline Hayes is a 69 year old female who presents with a persistent cough and dark green sputum production.  Her symptoms began on Saturday with hoarseness, which later developed into a cough. Initially, there was no nasal congestion, and she used Flonase as the only treatment. The hoarseness and laryngitis have since cleared up, but the cough has worsened.  After returning from vacation yesterday, she began feeling unwell and experienced increased coughing. Last night, she coughed up dark green sputum, described as 'spoonfuls' when brushing her teeth. No blood has been noticed in the sputum.  No fever, body aches, shortness of breath, or sinus pain. However, her left ear feels slightly full, and her throat and tongue feel raw. Her eyes have been watering since yesterday.  She has been urinating frequently, which she attributes to increased fluid intake and her use of furosemide  for edema in her ankles. No nausea, vomiting, or diarrhea.  She has a history of a leaking heart valve and takes furosemide  for edema. She denies any history of lung problems such as asthma or COPD.  Her daughter had the flu over Christmas, and her daughter-in-law had a sinus infection while she was on vacation. She recently returned  from a vacation at [facility], where she spent time with her family, including her grandchildren.      Objective:    BP (!) 111/52 (BP Location: Right Arm, Patient Position: Sitting, Cuff Size: Large)   Pulse 66   Temp 98.3 F (36.8 C) (Oral)   Ht 5' 3 (1.6 m)   Wt 193 lb 6.4 oz (87.7 kg)   SpO2 99%   BMI 34.26 kg/m   Physical Exam  Gen: Tired appearing woman, moderate frailty Ears: Bilateral tympanic membranes are normal, no erythema, no middle ear effusion Mouth: No oral lesions, no posterior erythema Neck: Mild tender cervical adenopathy on the left side Heart: Regular, no murmur Lungs; unlabored, mild coarse crackles in the right base, no wheezing, clear on the left base and otherwise throughout      Assessment & Plan:   Problem List Items Addressed This Visit       Unprioritized   Influenza-like illness - Primary   Likely viral etiology with hoarseness, cough, and dark green sputum. No fever recently. Differential includes viral infection and possible pneumonia. A chest x-ray is ordered due to age and exam with crackles in the right base to rule out pneumonia. Rest and hydration are advised. Recommend Tylenol  or ibuprofen, up to 400 mg twice a day. Hold furosemide  to maintain hydration. If the chest x-ray is clear, continue supportive care and monitor symptoms. If infiltrates are present, I will prescribe Augmentin .  Relevant Orders   DG Chest 2 View    Return if symptoms worsen or fail to improve.  Cleatus Debby Specking, MD [facility]   "

## 2024-11-21 NOTE — Telephone Encounter (Signed)
 FYI Only or Action Required?: FYI only for provider: 11/21/24.  Patient was last seen in primary care on 08/07/2024 by Kennyth Worth HERO, MD.  Called Nurse Triage reporting Cough.  Symptoms began a week ago.  Interventions attempted: OTC medications: flonase.  Symptoms are: gradually worsening.  Triage Disposition: See Today in Office (overriding Home Care)  Patient/caregiver understands and will follow disposition?: Yes Reason for Disposition  Cough  Answer Assessment - Initial Assessment Questions One week ago, pt developed laryngitis. Has progressed to a cough. Pt would like OTC options for symptoms. Advised Coricidin typically being recommended with HTN but that she should discuss options at OV d/t her cardiac hx.  Scheduled same day d/t age and length of illness.  1. ONSET: When did the cough begin?      One week 2. SEVERITY: How bad is the cough today?      States not keeping her up at night 3. SPUTUM: Describe the color of your sputum (e.g., none, dry cough; clear, white, yellow, green)     Green 4. HEMOPTYSIS: Are you coughing up any blood? If Yes, ask: How much? (e.g., flecks, streaks, tablespoons, etc.)     Denies 5. DIFFICULTY BREATHING: Are you having difficulty breathing? If Yes, ask: How bad is it? (e.g., mild, moderate, severe)      Denies SOB or wheezing 6. FEVER: Do you have a fever? If Yes, ask: What is your temperature, how was it measured, and when did it start?     Denies 7. CARDIAC HISTORY: Do you have any history of heart disease? (e.g., heart attack, congestive heart failure)      HTN, PVCs 8. LUNG HISTORY: Do you have any history of lung disease?  (e.g., pulmonary embolus, asthma, emphysema)     Denies  Protocols used: Cough - Acute Productive-A-AH   Copied from CRM #[id number]. Topic: Clinical - Medical Advice >> Nov 21, 2024  8:31 AM Macario HERO wrote: Reason for CRM: Patient is requesting a call from Dr. Shaune nurse regarding what  she should do since she's not feeling well. If there is any over the counter medication she can take. Past Medical History:  Diagnosis Date   Essential hypertension    History of kidney stones    Hypothyroidism    Mitral regurgitation    PVC's (premature ventricular contractions)

## 2024-11-21 NOTE — Telephone Encounter (Signed)
 I called and left a VM for the patient. Chest xray completed. Results look ok to me, no consolidation or opacity, makes pneumonia seem less likely. I recommend supportive care for now, I think low value to antibiotics now. Will follow up when I see the radiology results.

## 2024-11-21 NOTE — Assessment & Plan Note (Signed)
 Likely viral etiology with hoarseness, cough, and dark green sputum. No fever recently. Differential includes viral infection and possible pneumonia. A chest x-ray is ordered due to age and exam with crackles in the right base to rule out pneumonia. Rest and hydration are advised. Recommend Tylenol  or ibuprofen, up to 400 mg twice a day. Hold furosemide  to maintain hydration. If the chest x-ray is clear, continue supportive care and monitor symptoms. If infiltrates are present, I will prescribe Augmentin .

## 2024-11-25 ENCOUNTER — Ambulatory Visit: Admitting: Family Medicine

## 2024-11-25 ENCOUNTER — Encounter: Payer: Self-pay | Admitting: Family Medicine

## 2024-11-25 VITALS — BP 102/70 | HR 67 | Temp 98.0°F | Ht 63.0 in | Wt 190.4 lb

## 2024-11-25 DIAGNOSIS — I5032 Chronic diastolic (congestive) heart failure: Secondary | ICD-10-CM

## 2024-11-25 DIAGNOSIS — J329 Chronic sinusitis, unspecified: Secondary | ICD-10-CM

## 2024-11-25 DIAGNOSIS — I10 Essential (primary) hypertension: Secondary | ICD-10-CM | POA: Diagnosis not present

## 2024-11-25 MED ORDER — AMOXICILLIN-POT CLAVULANATE 875-125 MG PO TABS: 1.0000 | ORAL_TABLET | Freq: Two times a day (BID) | ORAL | 0 refills | Status: AC

## 2024-11-25 NOTE — Assessment & Plan Note (Signed)
 At goal today on losartan  100 mg daily and hydralazine  25 mg 3 times daily.

## 2024-11-25 NOTE — Patient Instructions (Signed)
 It was very nice to see you today!  VISIT SUMMARY: You visited us  today due to a persistent cough and new symptoms affecting your face and ear. We diagnosed you with sinusitis and otitis media, and your bronchitis appears to be improving.  YOUR PLAN: SINUSITIS AND OTITIS MEDIA: You have a bacterial infection affecting your sinuses and middle ear. -Take Augmentin , 1 pill twice daily for 10 days. -Stop using Flonase. -Use over-the-counter medications as needed and drink plenty of fluids. -Replace your toothbrush after you recover. -You are not contagious.  BRONCHITIS: Your cough has improved and is now dry and less frequent, indicating that your viral bronchitis is resolving. -No new medications are needed at this time.  GENERAL HEALTH MAINTENANCE: You have received your flu and COVID vaccinations. -These vaccinations are important to reduce the severity of infections, especially during a severe flu season.  Return if symptoms worsen or fail to improve.   Take care, Dr Kennyth  PLEASE NOTE:  If you had any lab tests, please let us  know if you have not heard back within a few days. You may see your results on mychart before we have a chance to review them but we will give you a call once they are reviewed by us .   If we ordered any referrals today, please let us  know if you have not heard from their office within the next week.   If you had any urgent prescriptions sent in today, please check with the pharmacy within an hour of our visit to make sure the prescription was transmitted appropriately.   Please try these tips to maintain a healthy lifestyle:  Eat at least 3 REAL meals and 1-2 snacks per day.  Aim for no more than 5 hours between eating.  If you eat breakfast, please do so within one hour of getting up.   Each meal should contain half fruits/vegetables, one quarter protein, and one quarter carbs (no bigger than a computer mouse)  Cut down on sweet beverages. This includes  juice, soda, and sweet tea.   Drink at least 1 glass of water with each meal and aim for at least 8 glasses per day  Exercise at least 150 minutes every week.

## 2024-11-25 NOTE — Assessment & Plan Note (Signed)
Continue management per cardiology.  Euvolemic today.

## 2024-11-25 NOTE — Progress Notes (Signed)
" ° °  Jacqueline Hayes is a 69 y.o. female who presents today for an office visit.  Assessment/Plan:  New/Acute Problems: Sinusitis / Otitis Media  No red flags.  Patient with URI symptoms for the last several weeks that has significantly worse in the last couple days.  Exam today does show right sided otitis media and history is also consistent with right sided sinusitis as well.  Given length of symptoms and worsening of symptoms we will treat with a course of Augmentin .  She typically tolerates this well.  Encouraged hydration.  She can use over-the-counter meds as needed as well.  We discussed reasons to return to care.  Follow-up as needed.  Chronic Problems Addressed Today: Essential hypertension At goal today on losartan  100 mg daily and hydralazine  25 mg 3 times daily.  CHF (congestive heart failure), NYHA class I, chronic, diastolic (HCC) Continue management per cardiology.  Euvolemic today.     Subjective:  HPI:  See assessment / plan for status of chronic conditions.    Discussed the use of AI scribe software for clinical note transcription with the patient, who gave verbal consent to proceed.  History of Present Illness Jacqueline Hayes is a 69 year old female who presents with persistent cough and new onset facial and ear symptoms.  She has been experiencing a persistent cough that began during a vacation after Christmas. Initially, the cough was productive with dark green sputum. She was evaluated last Friday and diagnosed with bronchitis after a chest x-ray ruled out pneumonia. The cough has since become dry and less frequent.  Two days ago, she developed new symptoms including a blocked right ear and a sensation of fullness on the right side of her face, particularly over and under her right eye. Her right eye also had discharge, and she noted difficulty clearing her right nostril, with green and slightly bloody discharge from the left nostril. She has not had a sinus  infection or earache in many years. Her right ear is now significantly blocked, affecting her hearing, and she occasionally experiences pain and popping sensations.  She has been using Flonase but feels it is not effective. She has been taking ibuprofen at night and using 'emergency' vitamin supplements. She has no known allergies to penicillin or sulfa  drugs but cannot take Macrobid due to a past reaction involving elevated liver enzymes.  She reports a dry cough, right ear blockage, facial fullness, and nasal congestion.         Objective:  Physical Exam: BP 102/70   Pulse 67   Temp 98 F (36.7 C) (Temporal)   Ht 5' 3 (1.6 m)   Wt 190 lb 6.4 oz (86.4 kg)   SpO2 97%   BMI 33.73 kg/m   Gen: No acute distress, resting comfortably HEENT: Left TM clear.  Right TM bulging and erythematous.  Nasal mucosa erythematous and boggy bilaterally.  OP clear. CV: Regular rate and rhythm with no murmurs appreciated Pulm: Normal work of breathing, clear to auscultation bilaterally with no crackles, wheezes, or rhonchi Neuro: Grossly normal, moves all extremities Psych: Normal affect and thought content      Venisha Boehning M. Kennyth, MD 11/25/2024 2:17 PM  "

## 2024-12-02 ENCOUNTER — Ambulatory Visit: Payer: Self-pay | Admitting: Student in an Organized Health Care Education/Training Program

## 2024-12-20 ENCOUNTER — Other Ambulatory Visit: Payer: Self-pay | Admitting: Cardiology

## 2024-12-24 NOTE — Telephone Encounter (Signed)
 In accordance with refill protocols, please review and address the following requirements before this medication refill can be authorized:  Labs  Pt needs labs with normal range within 365 days, medication sent to pt's pharmacy

## 2024-12-29 ENCOUNTER — Ambulatory Visit: Admitting: Cardiology

## 2025-08-13 ENCOUNTER — Encounter: Admitting: Family Medicine

## 2025-11-17 ENCOUNTER — Ambulatory Visit
